# Patient Record
Sex: Male | Born: 1990 | Race: Black or African American | Hispanic: No | Marital: Single | Smoking: Never smoker
Health system: Southern US, Community
[De-identification: ages and names within clinical notes are randomized; demographics above are authoritative.]

## PROBLEM LIST (undated history)

## (undated) HISTORY — PX: INCISION AND DRAINAGE ABSCESS: SHX5864

---
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
## 2011-01-28 ENCOUNTER — Emergency Department (HOSPITAL_COMMUNITY): Payer: BC Managed Care – PPO

## 2011-01-28 ENCOUNTER — Emergency Department (HOSPITAL_COMMUNITY)
Admission: EM | Admit: 2011-01-28 | Discharge: 2011-01-28 | Disposition: A | Discharge status: Home / Self Care | Payer: BC Managed Care – PPO | Attending: Emergency Medicine | Admitting: Emergency Medicine

## 2011-01-28 DIAGNOSIS — R071 Chest pain on breathing: Secondary | ICD-10-CM | POA: Insufficient documentation

## 2013-03-31 ENCOUNTER — Emergency Department (HOSPITAL_COMMUNITY)
Admission: EM | Admit: 2013-03-31 | Discharge: 2013-03-31 | Disposition: A | Discharge status: Home / Self Care | Payer: BC Managed Care – PPO | Attending: Emergency Medicine | Admitting: Emergency Medicine

## 2013-03-31 ENCOUNTER — Encounter (HOSPITAL_COMMUNITY): Payer: Self-pay | Admitting: Emergency Medicine

## 2013-03-31 DIAGNOSIS — R369 Urethral discharge, unspecified: Secondary | ICD-10-CM | POA: Insufficient documentation

## 2013-03-31 DIAGNOSIS — Z113 Encounter for screening for infections with a predominantly sexual mode of transmission: Secondary | ICD-10-CM | POA: Insufficient documentation

## 2013-03-31 DIAGNOSIS — N342 Other urethritis: Secondary | ICD-10-CM

## 2013-03-31 DIAGNOSIS — L539 Erythematous condition, unspecified: Secondary | ICD-10-CM | POA: Insufficient documentation

## 2013-03-31 DIAGNOSIS — Z711 Person with feared health complaint in whom no diagnosis is made: Secondary | ICD-10-CM

## 2013-03-31 LAB — GC/CHLAMYDIA PROBE AMP
CT Probe RNA: NEGATIVE
GC Probe RNA: NEGATIVE

## 2013-03-31 LAB — URINALYSIS, ROUTINE W REFLEX MICROSCOPIC
Ketones, ur: NEGATIVE mg/dL
Nitrite: NEGATIVE
Protein, ur: NEGATIVE mg/dL
Urobilinogen, UA: 0.2 mg/dL (ref 0.0–1.0)

## 2013-03-31 LAB — URINE MICROSCOPIC-ADD ON

## 2013-03-31 MED ORDER — CEFTRIAXONE SODIUM 250 MG IJ SOLR
250.0000 mg | Freq: Once | INTRAMUSCULAR | Status: AC
  Administered 2013-03-31: 250 mg via INTRAMUSCULAR
  Filled 2013-03-31: qty 250

## 2013-03-31 MED ORDER — LIDOCAINE HCL 1 % IJ SOLN
INTRAMUSCULAR | Status: AC
  Administered 2013-03-31: 1.9 mL
  Filled 2013-03-31: qty 20

## 2013-03-31 MED ORDER — AZITHROMYCIN 250 MG PO TABS
1000.0000 mg | ORAL_TABLET | Freq: Once | ORAL | Status: AC
  Administered 2013-03-31: 1000 mg via ORAL
  Filled 2013-03-31: qty 4

## 2013-03-31 NOTE — ED Provider Notes (Signed)
CSN: [id number]     Arrival date & time 03/31/13  0247 History   First MD Initiated Contact with Patient 03/31/13 0257     Chief Complaint  Patient presents with  . SEXUALLY TRANSMITTED DISEASE   (Consider location/radiation/quality/duration/timing/severity/associated sxs/prior Treatment) Patient is a 22 y.o. male presenting with male genitourinary complaint.  Male GU Problem Presenting symptoms: penile discharge (moderate amount, yellow) and penile pain   Presenting symptoms: no scrotal pain   Context: spontaneously   Relieved by:  Nothing Worsened by:  Tactile pressure Ineffective treatments:  None tried Associated symptoms: penile redness   Associated symptoms: no abdominal pain, no fever, no genital lesions, no scrotal swelling and no vomiting   Risk factors: multiple sexual partners and unprotected sex     History reviewed. No pertinent past medical history. Past Surgical History  Procedure Laterality Date  . Incision and drainage abscess     No family history on file. History  Substance Use Topics  . Smoking status: Never Smoker   . Smokeless tobacco: Not on file  . Alcohol Use: No    Review of Systems  Constitutional: Negative for fever.  Gastrointestinal: Negative for vomiting and abdominal pain.  Genitourinary: Positive for discharge (moderate amount, yellow) and penile pain. Negative for scrotal swelling.  All other systems reviewed and are negative.    Allergies  Review of patient's allergies indicates no known allergies.  Home Medications  No current outpatient prescriptions on file. BP 124/71  Pulse 64  Temp(Src) 98.4 F (36.9 C)  Resp 20  SpO2 97% Physical Exam  Nursing note and vitals reviewed. Constitutional: He is oriented to person, place, and time. He appears well-developed and well-nourished. No distress.  HENT:  Head: Normocephalic and atraumatic.  Eyes: Conjunctivae are normal. No scleral icterus.  Neck: Neck supple.  Cardiovascular:  Normal rate and intact distal pulses.   Pulmonary/Chest: Effort normal. No stridor. No respiratory distress.  Abdominal: Soft. Normal appearance. He exhibits no distension. There is no tenderness. There is no rebound and no guarding.  Genitourinary: Testes normal. Right testis shows no tenderness. Left testis shows no tenderness. Uncircumcised. Penile erythema and penile tenderness present. No phimosis or paraphimosis. Discharge found.  Neurological: He is alert and oriented to person, place, and time.  Skin: Skin is warm and dry. No rash noted.  Psychiatric: He has a normal mood and affect. His behavior is normal.    ED Course  Procedures (including critical care time) Labs Review Labs Reviewed  URINALYSIS, ROUTINE W REFLEX MICROSCOPIC - Abnormal; Notable for the following:    Hgb urine dipstick SMALL (*)    Leukocytes, UA SMALL (*)    All other components within normal limits  GC/CHLAMYDIA PROBE AMP  URINE MICROSCOPIC-ADD ON   Imaging Review No results found.  EKG Interpretation   None       MDM   1. Concern about sexually transmitted disease in male without diagnosis   2. Urethritis    Penile discharge and pain.  No lesions.  Rocephin and azithro presumptively.  GC/Chl pending.        Candyce Churn, MD 03/31/13 [phone]

## 2013-03-31 NOTE — ED Notes (Signed)
Pt states may have "STI"; c/o discharge and irritation; states has had unprotected sex;

## 2013-05-16 ENCOUNTER — Encounter (HOSPITAL_COMMUNITY): Payer: Self-pay | Admitting: Emergency Medicine

## 2013-05-16 ENCOUNTER — Emergency Department (HOSPITAL_COMMUNITY)
Admission: EM | Admit: 2013-05-16 | Discharge: 2013-05-16 | Disposition: A | Discharge status: Home / Self Care | Payer: BC Managed Care – PPO | Attending: Emergency Medicine | Admitting: Emergency Medicine

## 2013-05-16 DIAGNOSIS — K5289 Other specified noninfective gastroenteritis and colitis: Secondary | ICD-10-CM | POA: Insufficient documentation

## 2013-05-16 DIAGNOSIS — K529 Noninfective gastroenteritis and colitis, unspecified: Secondary | ICD-10-CM

## 2013-05-16 NOTE — ED Notes (Signed)
Pt c/o gen abd pain x 1 day w/ diarrhea.

## 2013-05-16 NOTE — Discharge Instructions (Signed)
Return to the emergency department for severe pain, bloody stool, or no urine output in 12 hours.   Viral Gastroenteritis Viral gastroenteritis is also known as stomach flu. This condition affects the stomach and intestinal tract. It can cause sudden diarrhea and vomiting. The illness typically lasts 3 to 8 days. Most people develop an immune response that eventually gets rid of the virus. While this natural response develops, the virus can make you quite ill. CAUSES  Many different viruses can cause gastroenteritis, such as rotavirus or noroviruses. You can catch one of these viruses by consuming contaminated food or water. You may also catch a virus by sharing utensils or other personal items with an infected person or by touching a contaminated surface. SYMPTOMS  The most common symptoms are diarrhea and vomiting. These problems can cause a severe loss of body fluids (dehydration) and a body salt (electrolyte) imbalance. Other symptoms may include:  Fever.  Headache.  Fatigue.  Abdominal pain. DIAGNOSIS  Your caregiver can usually diagnose viral gastroenteritis based on your symptoms and a physical exam. A stool sample may also be taken to test for the presence of viruses or other infections. TREATMENT  This illness typically goes away on its own. Treatments are aimed at rehydration. The most serious cases of viral gastroenteritis involve vomiting so severely that you are not able to keep fluids down. In these cases, fluids must be given through an intravenous line (IV). HOME CARE INSTRUCTIONS   Drink enough fluids to keep your urine clear or pale yellow. Drink small amounts of fluids frequently and increase the amounts as tolerated.  Ask your caregiver for specific rehydration instructions.  Avoid:  Foods high in sugar.  Alcohol.  Carbonated drinks.  Tobacco.  Juice.  Caffeine drinks.  Extremely hot or cold fluids.  Fatty, greasy foods.  Too much intake of anything at  one time.  Dairy products until 24 to 48 hours after diarrhea stops.  You may consume probiotics. Probiotics are active cultures of beneficial bacteria. They may lessen the amount and number of diarrheal stools in adults. Probiotics can be found in yogurt with active cultures and in supplements.  Wash your hands well to avoid spreading the virus.  Only take over-the-counter or prescription medicines for pain, discomfort, or fever as directed by your caregiver. Do not give aspirin to children. Antidiarrheal medicines are not recommended.  Ask your caregiver if you should continue to take your regular prescribed and over-the-counter medicines.  Keep all follow-up appointments as directed by your caregiver. SEEK IMMEDIATE MEDICAL CARE IF:   You are unable to keep fluids down.  You do not urinate at least once every 6 to 8 hours.  You develop shortness of breath.  You notice blood in your stool or vomit. This may look like coffee grounds.  You have abdominal pain that increases or is concentrated in one small area (localized).  You have persistent vomiting or diarrhea.  You have a fever.  The patient is a child younger than 3 months, and he or she has a fever.  The patient is a child older than 3 months, and he or she has a fever and persistent symptoms.  The patient is a child older than 3 months, and he or she has a fever and symptoms suddenly get worse.  The patient is a baby, and he or she has no tears when crying. MAKE SURE YOU:   Understand these instructions.  Will watch your condition.  Will get help right  away if you are not doing well or get worse. Document Released: 04/22/2005 Document Revised: 07/15/2011 Document Reviewed: 02/06/2011 [facility] Patient Information 2014 [place], [place].

## 2013-05-16 NOTE — ED Provider Notes (Signed)
CSN: [id number]     Arrival date & time 05/16/13  1129 History   First MD Initiated Contact with Patient 05/16/13 1155     Chief Complaint  Patient presents with  . Abdominal Pain  . Diarrhea   (Consider location/radiation/quality/duration/timing/severity/associated sxs/prior Treatment) HPI Comments: Patient is a 23 year old male with a two-day history of abdominal cramping and loose stools. He has not had any vomiting and denies fever. His child at home was sick in a similar fashion several days ago. He just urinated prior to being placed in the exam room.  Patient is a 23 y.o. male presenting with abdominal pain and diarrhea. The history is provided by the patient.  Abdominal Pain Pain location:  Generalized Pain quality: cramping   Pain severity:  Moderate Onset quality:  Gradual Duration:  2 days Timing:  Constant Progression:  Partially resolved Chronicity:  New Relieved by:  Nothing Worsened by:  Nothing tried Associated symptoms: diarrhea   Diarrhea Associated symptoms: abdominal pain     History reviewed. No pertinent past medical history. Past Surgical History  Procedure Laterality Date  . Incision and drainage abscess     No family history on file. History  Substance Use Topics  . Smoking status: Never Smoker   . Smokeless tobacco: Not on file  . Alcohol Use: No    Review of Systems  Gastrointestinal: Positive for abdominal pain and diarrhea.  All other systems reviewed and are negative.    Allergies  Review of patient's allergies indicates no known allergies.  Home Medications   Current Outpatient Rx  Name  Route  Sig  Dispense  Refill  . ibuprofen (ADVIL,MOTRIN) 200 MG tablet   Oral   Take 400 mg by mouth every 6 (six) hours as needed.          BP 133/69  Pulse 73  Temp(Src) 98.6 F (37 C) (Oral)  Resp 14  Ht 5\' 7"  (1.702 m)  Wt 190 lb (86.183 kg)  BMI 29.75 kg/m2  SpO2 97% Physical Exam  Nursing note and vitals  reviewed. Constitutional: He is oriented to person, place, and time. He appears well-developed and well-nourished. No distress.  HENT:  Head: Normocephalic and atraumatic.  Mouth/Throat: Oropharynx is clear and moist.  Neck: Normal range of motion. Neck supple.  Cardiovascular: Normal rate, regular rhythm and normal heart sounds.   No murmur heard. Pulmonary/Chest: Effort normal and breath sounds normal. No respiratory distress. He has no wheezes.  Abdominal: Soft. Bowel sounds are normal. He exhibits no distension and no mass. There is no tenderness.  Musculoskeletal: Normal range of motion. He exhibits no edema.  Neurological: He is alert and oriented to person, place, and time.  Skin: Skin is warm and dry. He is not diaphoretic.    ED Course  Procedures (including critical care time) Labs Review Labs Reviewed - No data to display Imaging Review No results found.    MDM  No diagnosis found. Symptoms sound viral in nature. He appears well-hydrated states that his symptoms are actually improving. He is requesting a work note. Will discharge to home with instructions to return for bloody stool severe pain or other problems.    Geoffery Lyonsouglas Anush Wiedeman, MD 05/16/13 [phone]

## 2014-05-26 ENCOUNTER — Encounter (HOSPITAL_COMMUNITY): Payer: Self-pay | Admitting: *Deleted

## 2014-05-26 ENCOUNTER — Emergency Department (HOSPITAL_COMMUNITY)
Admission: EM | Admit: 2014-05-26 | Discharge: 2014-05-26 | Disposition: A | Discharge status: Home / Self Care | Payer: No Typology Code available for payment source | Attending: Emergency Medicine | Admitting: Emergency Medicine

## 2014-05-26 DIAGNOSIS — Y998 Other external cause status: Secondary | ICD-10-CM | POA: Diagnosis not present

## 2014-05-26 DIAGNOSIS — Y9389 Activity, other specified: Secondary | ICD-10-CM | POA: Insufficient documentation

## 2014-05-26 DIAGNOSIS — Y92410 Unspecified street and highway as the place of occurrence of the external cause: Secondary | ICD-10-CM | POA: Insufficient documentation

## 2014-05-26 DIAGNOSIS — S161XXA Strain of muscle, fascia and tendon at neck level, initial encounter: Secondary | ICD-10-CM | POA: Insufficient documentation

## 2014-05-26 DIAGNOSIS — S199XXA Unspecified injury of neck, initial encounter: Secondary | ICD-10-CM | POA: Diagnosis present

## 2014-05-26 MED ORDER — CYCLOBENZAPRINE HCL 10 MG PO TABS: 10.0000 mg | ORAL_TABLET | Freq: Two times a day (BID) | ORAL | Status: DC | PRN

## 2014-05-26 MED ORDER — IBUPROFEN 800 MG PO TABS: 800.0000 mg | ORAL_TABLET | Freq: Three times a day (TID) | ORAL | Status: DC
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
## 2014-05-26 NOTE — Discharge Instructions (Signed)
Cryotherapy °Cryotherapy is when you put ice on your injury. Ice helps lessen pain and puffiness (swelling) after an injury. Ice works the best when you start using it in the first 24 to 48 hours after an injury. °HOME CARE °· Put a dry or damp towel between the ice pack and your skin. °· You may press gently on the ice pack. °· Leave the ice on for no more than 10 to 20 minutes at a time. °· Check your skin after 5 minutes to make sure your skin is okay. °· Rest at least 20 minutes between ice pack uses. °· Stop using ice when your skin loses feeling (numbness). °· Do not use ice on someone who cannot tell you when it hurts. This includes small children and people with memory problems (dementia). °GET HELP RIGHT AWAY IF: °· You have white spots on your skin. °· Your skin turns blue or pale. °· Your skin feels waxy or hard. °· Your puffiness gets worse. °MAKE SURE YOU:  °· Understand these instructions. °· Will watch your condition. °· Will get help right away if you are not doing well or get worse. °Document Released: 10/09/2007 Document Revised: 07/15/2011 Document Reviewed: 12/13/2010 °Exit[employer]® Patient Information ©2015 [employer]. This information is not intended to replace advice given to you by your health care provider. Make sure you discuss any questions you have with your health care provider. ° °Cervical Sprain °A cervical sprain is an injury in the neck in which the strong, fibrous tissues (ligaments) that connect your neck bones stretch or tear. Cervical sprains can range from mild to severe. Severe cervical sprains can cause the neck vertebrae to be unstable. This can lead to damage of the spinal cord and can result in serious nervous system problems. The amount of time it takes for a cervical sprain to get better depends on the cause and extent of the injury. Most cervical sprains heal in 1 to 3 weeks. °CAUSES  °Severe cervical sprains may be caused by:  °· Contact sport injuries (such as from  football, rugby, wrestling, hockey, auto racing, gymnastics, diving, martial arts, or boxing).   °· Motor vehicle collisions.   °· Whiplash injuries. This is an injury from a sudden forward and backward whipping movement of the head and neck.  °· Falls.   °Mild cervical sprains may be caused by:  °· Being in an awkward position, such as while cradling a telephone between your ear and shoulder.   °· Sitting in a chair that does not offer proper support.   °· Working at a poorly designed computer station.   °· Looking up or down for long periods of time.   °SYMPTOMS  °· Pain, soreness, stiffness, or a burning sensation in the front, back, or sides of the neck. This discomfort may develop immediately after the injury or slowly, 24 hours or more after the injury.   °· Pain or tenderness directly in the middle of the back of the neck.   °· Shoulder or upper back pain.   °· Limited ability to move the neck.   °· Headache.   °· Dizziness.   °· Weakness, numbness, or tingling in the hands or arms.   °· Muscle spasms.   °· Difficulty swallowing or chewing.   °· Tenderness and swelling of the neck.   °DIAGNOSIS  °Most of the time your health care provider can diagnose a cervical sprain by taking your history and doing a physical exam. Your health care provider will ask about previous neck injuries and any known neck problems, such as arthritis in the neck.   X-rays may be taken to find out if there are any other problems, such as with the bones of the neck. Other tests, such as a CT scan or MRI, may also be needed.  °TREATMENT  °Treatment depends on the severity of the cervical sprain. Mild sprains can be treated with rest, keeping the neck in place (immobilization), and pain medicines. Severe cervical sprains are immediately immobilized. Further treatment is done to help with pain, muscle spasms, and other symptoms and may include: °· Medicines, such as pain relievers, numbing medicines, or muscle relaxants.   °· Physical  therapy. This may involve stretching exercises, strengthening exercises, and posture training. Exercises and improved posture can help stabilize the neck, strengthen muscles, and help stop symptoms from returning.   °HOME CARE INSTRUCTIONS  °· Put ice on the injured area.   °¨ Put ice in a plastic bag.   °¨ Place a towel between your skin and the bag.   °¨ Leave the ice on for 15-20 minutes, 3-4 times a day.   °· If your injury was severe, you may have been given a cervical collar to wear. A cervical collar is a two-piece collar designed to keep your neck from moving while it heals. °¨ Do not remove the collar unless instructed by your health care provider. °¨ If you have long hair, keep it outside of the collar. °¨ Ask your health care provider before making any adjustments to your collar. Minor adjustments may be required over time to improve comfort and reduce pressure on your chin or on the back of your head. °¨ If you are allowed to remove the collar for cleaning or bathing, follow your health care provider's instructions on how to do so safely. °¨ Keep your collar clean by wiping it with mild soap and water and drying it completely. If the collar you have been given includes removable pads, remove them every 1-2 days and hand wash them with soap and water. Allow them to air dry. They should be completely dry before you wear them in the collar. °¨ If you are allowed to remove the collar for cleaning and bathing, wash and dry the skin of your neck. Check your skin for irritation or sores. If you see any, tell your health care provider. °¨ Do not drive while wearing the collar.   °· Only take over-the-counter or prescription medicines for pain, discomfort, or fever as directed by your health care provider.   °· Keep all follow-up appointments as directed by your health care provider.   °· Keep all physical therapy appointments as directed by your health care provider.   °· Make any needed adjustments to your  workstation to promote good posture.   °· Avoid positions and activities that make your symptoms worse.   °· Warm up and stretch before being active to help prevent problems.   °SEEK MEDICAL CARE IF:  °· Your pain is not controlled with medicine.   °· You are unable to decrease your pain medicine over time as planned.   °· Your activity level is not improving as expected.   °SEEK IMMEDIATE MEDICAL CARE IF:  °· You develop any bleeding. °· You develop stomach upset. °· You have signs of an allergic reaction to your medicine.   °· Your symptoms get worse.   °· You develop new, unexplained symptoms.   °· You have numbness, tingling, weakness, or paralysis in any part of your body.   °MAKE SURE YOU:  °· Understand these instructions. °· Will watch your condition. °· Will get help right away if you are not doing well or get worse. °  Document Released: 02/17/2007 Document Revised: 04/27/2013 Document Reviewed: 10/28/2012 °ExitCare® Patient Information ©2015 [employer]. This information is not intended to replace advice given to you by your health care provider. Make sure you discuss any questions you have with your health care provider. °Motor Vehicle Collision °It is common to have multiple bruises and sore muscles after a motor vehicle collision (MVC). These tend to feel worse for the first 24 hours. You may have the most stiffness and soreness over the first several hours. You may also feel worse when you wake up the first morning after your collision. After this point, you will usually begin to improve with each day. The speed of improvement often depends on the severity of the collision, the number of injuries, and the location and nature of these injuries. °HOME CARE INSTRUCTIONS °· Put ice on the injured area. °¨ Put ice in a plastic bag. °¨ Place a towel between your skin and the bag. °¨ Leave the ice on for 15-20 minutes, 3-4 times a day, or as directed by your health care provider. °· Drink enough fluids to  keep your urine clear or pale yellow. Do not drink alcohol. °· Take a warm shower or bath once or twice a day. This will increase blood flow to sore muscles. °· You may return to activities as directed by your caregiver. Be careful when lifting, as this may aggravate neck or back pain. °· Only take over-the-counter or prescription medicines for pain, discomfort, or fever as directed by your caregiver. Do not use aspirin. This may increase bruising and bleeding. °SEEK IMMEDIATE MEDICAL CARE IF: °· You have numbness, tingling, or weakness in the arms or legs. °· You develop severe headaches not relieved with medicine. °· You have severe neck pain, especially tenderness in the middle of the back of your neck. °· You have changes in bowel or bladder control. °· There is increasing pain in any area of the body. °· You have shortness of breath, light-headedness, dizziness, or fainting. °· You have chest pain. °· You feel sick to your stomach (nauseous), throw up (vomit), or sweat. °· You have increasing abdominal discomfort. °· There is blood in your urine, stool, or vomit. °· You have pain in your shoulder (shoulder strap areas). °· You feel your symptoms are getting worse. °MAKE SURE YOU: °· Understand these instructions. °· Will watch your condition. °· Will get help right away if you are not doing well or get worse. °Document Released: 04/22/2005 Document Revised: 09/06/2013 Document Reviewed: 09/19/2010 °ExitCare® Patient Information ©2015 [employer]. This information is not intended to replace advice given to you by your health care provider. Make sure you discuss any questions you have with your health care provider. ° °

## 2017-02-20 ENCOUNTER — Encounter (HOSPITAL_BASED_OUTPATIENT_CLINIC_OR_DEPARTMENT_OTHER): Payer: Self-pay | Admitting: Emergency Medicine

## 2017-02-20 ENCOUNTER — Emergency Department (HOSPITAL_BASED_OUTPATIENT_CLINIC_OR_DEPARTMENT_OTHER)
Admission: EM | Admit: 2017-02-20 | Discharge: 2017-02-20 | Disposition: A | Discharge status: Home / Self Care | Payer: Self-pay | Attending: Emergency Medicine | Admitting: Emergency Medicine

## 2017-02-20 DIAGNOSIS — K029 Dental caries, unspecified: Secondary | ICD-10-CM | POA: Insufficient documentation

## 2017-02-20 MED ORDER — NAPROXEN 250 MG PO TABS
500.0000 mg | ORAL_TABLET | Freq: Once | ORAL | Status: AC
  Administered 2017-02-20: 500 mg via ORAL
  Filled 2017-02-20: qty 2

## 2017-02-20 MED ORDER — PENICILLIN V POTASSIUM 500 MG PO TABS: 500.0000 mg | ORAL_TABLET | Freq: Four times a day (QID) | ORAL | 0 refills | Status: AC

## 2017-02-20 MED ORDER — NAPROXEN 375 MG PO TABS: 375.0000 mg | ORAL_TABLET | Freq: Two times a day (BID) | ORAL | 0 refills | Status: DC

## 2017-02-20 MED ORDER — PENICILLIN V POTASSIUM 250 MG PO TABS
500.0000 mg | ORAL_TABLET | Freq: Once | ORAL | Status: AC
  Administered 2017-02-20: 500 mg via ORAL
  Filled 2017-02-20: qty 2

## 2017-02-20 NOTE — ED Triage Notes (Signed)
Pt c/o tooth pain since yesterday. Pt has not taken any medication OTC for pain.

## 2017-02-20 NOTE — ED Provider Notes (Signed)
[facility] EMERGENCY DEPARTMENT Provider Note   CSN: [id number] Arrival date & time: 02/20/17  0310     History   Chief Complaint Chief Complaint  Patient presents with  . Dental Pain    HPI Raymond Lang is a 26 y.o. male.  The history is provided by the patient.  Dental Pain   This is a new problem. The current episode started more than 2 days ago. The problem occurs constantly. The problem has not changed since onset.The pain is severe. Treatments tried: a dose of ibuprofen. The treatment provided no relief.    History reviewed. No pertinent past medical history.  There are no active problems to display for this patient.   Past Surgical History:  Procedure Laterality Date  . INCISION AND DRAINAGE ABSCESS         Home Medications    Prior to Admission medications   Medication Sig Start Date End Date Taking? Authorizing Provider  naproxen (NAPROSYN) 375 MG tablet Take 1 tablet (375 mg total) by mouth 2 (two) times daily. 02/20/17   Halea Lieb, MD  penicillin v potassium (VEETID) 500 MG tablet Take 1 tablet (500 mg total) by mouth 4 (four) times daily. 02/20/17 02/27/17  Alphus Zeck, MD    Family History No family history on file.  Social History Social History  Substance Use Topics  . Smoking status: Never Smoker  . Smokeless tobacco: Never Used  . Alcohol use No     Allergies   Patient has no known allergies.   Review of Systems Review of Systems  Constitutional: Negative for fever.  HENT: Positive for dental problem. Negative for congestion, drooling, sore throat, trouble swallowing and voice change.   Hematological: Negative for adenopathy.  All other systems reviewed and are negative.    Physical Exam Updated Vital Signs BP 131/71 (BP Location: Left Arm)   Pulse 75   Temp 98 F (36.7 C) (Oral)   Resp 18   Ht 5\' 8"  (1.727 m)   Wt 88.9 kg (196 lb)   SpO2 97%   BMI 29.80 kg/m   Physical Exam  Constitutional: He is  oriented to person, place, and time. He appears well-developed and well-nourished. No distress.  HENT:  Head: Normocephalic and atraumatic.  Mouth/Throat: No trismus in the jaw. Dental caries present. No dental abscesses. No oropharyngeal exudate.  Eyes: Pupils are equal, round, and reactive to light. Conjunctivae are normal.  Neck: Normal range of motion. Neck supple. No tracheal deviation present.  Cardiovascular: Normal rate, regular rhythm, normal heart sounds and intact distal pulses.   Pulmonary/Chest: Effort normal and breath sounds normal. He has no wheezes. He has no rales.  Abdominal: Soft. Bowel sounds are normal. He exhibits no mass. There is no tenderness. There is no rebound and no guarding.  Musculoskeletal: Normal range of motion.  Lymphadenopathy:    He has no cervical adenopathy.  Neurological: He is alert and oriented to person, place, and time.  Skin: Skin is warm. Capillary refill takes less than 2 seconds.  Psychiatric: He has a normal mood and affect.     ED Treatments / Results   Vitals:   02/20/17 0316  BP: 131/71  Pulse: 75  Resp: 18  Temp: 98 F (36.7 C)  SpO2: 97%    Radiology No results found.  Procedures Procedures (including critical care time)  Medications Ordered in ED Medications  penicillin v potassium (VEETID) tablet 500 mg (500 mg Oral Given 02/20/17 0328)  naproxen (NAPROSYN)  tablet 500 mg (500 mg Oral Given 02/20/17 0328)      Final Clinical Impressions(s) / ED Diagnoses   Final diagnoses:  Dental caries   Strict return precautions given for  Shortness of breath, swelling or the lips or tongue, chest pain, dyspnea on exertion, new weakness or numbness changes in vision or speech,  Inability to tolerate liquids or food, changes in voice cough, altered mental status or any concerns. No signs of systemic illness or infection. The patient is nontoxic-appearing on exam and vital signs are within normal limits.    I have reviewed  the triage vital signs and the nursing notes. Pertinent labs &imaging results that were available during my care of the patient were reviewed by me and considered in my medical decision making (see chart for details).  After history, exam, and medical workup I feel the patient has been appropriately medically screened and is safe for discharge home. Pertinent diagnoses were discussed with the patient. Patient was given return precautions.  New Prescriptions New Prescriptions   NAPROXEN (NAPROSYN) 375 MG TABLET    Take 1 tablet (375 mg total) by mouth 2 (two) times daily.   PENICILLIN V POTASSIUM (VEETID) 500 MG TABLET    Take 1 tablet (500 mg total) by mouth 4 (four) times daily.     Makeila Yamaguchi, MD 02/20/17 [phone]

## 2020-09-29 ENCOUNTER — Emergency Department (HOSPITAL_COMMUNITY): Payer: 59

## 2020-09-29 ENCOUNTER — Observation Stay (HOSPITAL_COMMUNITY)
Admission: EM | Admit: 2020-09-29 | Discharge: 2020-09-30 | Disposition: A | Discharge status: Home / Self Care | Payer: 59 | Attending: Internal Medicine | Admitting: Internal Medicine

## 2020-09-29 ENCOUNTER — Other Ambulatory Visit: Payer: Self-pay

## 2020-09-29 ENCOUNTER — Encounter (HOSPITAL_COMMUNITY): Payer: Self-pay

## 2020-09-29 DIAGNOSIS — Z20822 Contact with and (suspected) exposure to covid-19: Secondary | ICD-10-CM | POA: Diagnosis not present

## 2020-09-29 DIAGNOSIS — N179 Acute kidney failure, unspecified: Secondary | ICD-10-CM | POA: Insufficient documentation

## 2020-09-29 DIAGNOSIS — J810 Acute pulmonary edema: Secondary | ICD-10-CM

## 2020-09-29 DIAGNOSIS — T402X1A Poisoning by other opioids, accidental (unintentional), initial encounter: Secondary | ICD-10-CM | POA: Diagnosis present

## 2020-09-29 DIAGNOSIS — J9601 Acute respiratory failure with hypoxia: Secondary | ICD-10-CM | POA: Insufficient documentation

## 2020-09-29 DIAGNOSIS — T40601A Poisoning by unspecified narcotics, accidental (unintentional), initial encounter: Secondary | ICD-10-CM

## 2020-09-29 DIAGNOSIS — R0902 Hypoxemia: Secondary | ICD-10-CM

## 2020-09-29 DIAGNOSIS — I21A1 Myocardial infarction type 2: Secondary | ICD-10-CM | POA: Insufficient documentation

## 2020-09-29 LAB — CBC WITH DIFFERENTIAL/PLATELET
Abs Immature Granulocytes: 0.09 10*3/uL — ABNORMAL HIGH (ref 0.00–0.07)
Basophils Absolute: 0 10*3/uL (ref 0.0–0.1)
Basophils Relative: 0 %
Eosinophils Absolute: 0 10*3/uL (ref 0.0–0.5)
Eosinophils Relative: 0 %
HCT: 45.8 % (ref 39.0–52.0)
Hemoglobin: 15.3 g/dL (ref 13.0–17.0)
Immature Granulocytes: 1 %
Lymphocytes Relative: 6 %
Lymphs Abs: 0.8 10*3/uL (ref 0.7–4.0)
MCH: 29.9 pg (ref 26.0–34.0)
MCHC: 33.4 g/dL (ref 30.0–36.0)
MCV: 89.5 fL (ref 80.0–100.0)
Monocytes Absolute: 0.7 10*3/uL (ref 0.1–1.0)
Monocytes Relative: 5 %
Neutro Abs: 12.5 10*3/uL — ABNORMAL HIGH (ref 1.7–7.7)
Neutrophils Relative %: 88 %
Platelets: 223 10*3/uL (ref 150–400)
RBC: 5.12 MIL/uL (ref 4.22–5.81)
RDW: 11.7 % (ref 11.5–15.5)
WBC: 14.2 10*3/uL — ABNORMAL HIGH (ref 4.0–10.5)
nRBC: 0 % (ref 0.0–0.2)

## 2020-09-29 LAB — RAPID URINE DRUG SCREEN, HOSP PERFORMED
Amphetamines: NOT DETECTED
Barbiturates: NOT DETECTED
Benzodiazepines: NOT DETECTED
Cocaine: NOT DETECTED
Opiates: NOT DETECTED
Tetrahydrocannabinol: POSITIVE — AB

## 2020-09-29 LAB — COMPREHENSIVE METABOLIC PANEL
ALT: 18 U/L (ref 0–44)
AST: 22 U/L (ref 15–41)
Albumin: 3.9 g/dL (ref 3.5–5.0)
Alkaline Phosphatase: 65 U/L (ref 38–126)
Anion gap: 7 (ref 5–15)
BUN: 13 mg/dL (ref 6–20)
CO2: 29 mmol/L (ref 22–32)
Calcium: 9.3 mg/dL (ref 8.9–10.3)
Chloride: 104 mmol/L (ref 98–111)
Creatinine, Ser: 1.34 mg/dL — ABNORMAL HIGH (ref 0.61–1.24)
GFR, Estimated: >60 mL/min (ref >60)
Glucose, Bld: 107 mg/dL — ABNORMAL HIGH (ref 70–99)
Potassium: 3.5 mmol/L (ref 3.5–5.1)
Sodium: 140 mmol/L (ref 135–145)
Total Bilirubin: 1.1 mg/dL (ref 0.3–1.2)
Total Protein: 6.6 g/dL (ref 6.5–8.1)

## 2020-09-29 LAB — TROPONIN I (HIGH SENSITIVITY)
Troponin I (High Sensitivity): 34 ng/L — ABNORMAL HIGH (ref <18)
Troponin I (High Sensitivity): 92 ng/L — ABNORMAL HIGH (ref <18)

## 2020-09-29 LAB — RESP PANEL BY RT-PCR (FLU A&B, COVID) ARPGX2
Influenza A by PCR: NEGATIVE
Influenza B by PCR: NEGATIVE
SARS Coronavirus 2 by RT PCR: NEGATIVE

## 2020-09-29 LAB — BRAIN NATRIURETIC PEPTIDE: B Natriuretic Peptide: 17.7 pg/mL (ref 0.0–100.0)

## 2020-09-29 LAB — CK: Total CK: 198 U/L (ref 49–397)

## 2020-09-29 MED ORDER — LACTATED RINGERS IV BOLUS
1000.0000 mL | Freq: Once | INTRAVENOUS | Status: AC
  Administered 2020-09-29: 1000 mL via INTRAVENOUS

## 2020-09-29 MED ORDER — ACETAMINOPHEN 325 MG PO TABS
650.0000 mg | ORAL_TABLET | Freq: Four times a day (QID) | ORAL | Status: DC | PRN
  Administered 2020-09-30: 650 mg via ORAL
  Filled 2020-09-29: qty 2

## 2020-09-29 MED ORDER — ACETAMINOPHEN 650 MG RE SUPP: 650.0000 mg | Freq: Four times a day (QID) | RECTAL | Status: DC | PRN

## 2020-09-29 MED ORDER — ENOXAPARIN SODIUM 40 MG/0.4ML IJ SOSY
40.0000 mg | PREFILLED_SYRINGE | INTRAMUSCULAR | Status: DC
  Administered 2020-09-29: 40 mg via SUBCUTANEOUS
  Filled 2020-09-29: qty 0.4

## 2020-09-29 MED ORDER — ONDANSETRON HCL 4 MG/2ML IJ SOLN: 4.0000 mg | Freq: Once | INTRAMUSCULAR | Status: DC

## 2020-09-29 NOTE — ED Notes (Signed)
Pt O2 noted to be 74% RA. Notified provider

## 2020-09-29 NOTE — ED Provider Notes (Signed)
[facility] EMERGENCY DEPARTMENT Provider Note   CSN: [id number] Arrival date & time: 09/29/20  1538     History Chief Complaint  Patient presents with  . Drug Overdose    Raymond Lang is a 30 y.o. male presents to the Emergency Department complaining of acute, improved overdose.  Per EMS, patient found unresponsive in his vehicle.  First arriving responders had to break the window to access patient.  Narcan was given.  Patient reports he remembers helping his sister move earlier today.  He then snorted 1 tablet of what he presumed was Percocet.  He reports that he occasionally snorts it and sometimes takes them.  Denies alcohol usage.  Denies IV drug usage.  Denies snorting or other utilization of cocaine, heroin or other drugs.  He does report that he smokes marijuana intermittently.  Drinks socially.  Patient denies a history of drug overdose.  Reports that he is anxious at this time and tired but denies pain.  He does not know if he vomited.  No additional aggravating or alleviating factors.  Patient denies suicidal ideation, homicidal ideation, auditory or visual hallucinations.  He is adamant that today's overdose was accidental.  Mother at bedside assists with history.  She reports that to her knowledge this has never happened before. The history is provided by the patient and medical records. No language interpreter was used.       History reviewed. No pertinent past medical history.  There are no problems to display for this patient.   Past Surgical History:  Procedure Laterality Date  . INCISION AND DRAINAGE ABSCESS         No family history on file.  Social History   Tobacco Use  . Smoking status: Never Smoker  . Smokeless tobacco: Never Used  Vaping Use  . Vaping Use: Every day  Substance Use Topics  . Alcohol use: No  . Drug use: Never    Home Medications Prior to Admission medications   Medication Sig Start Date End Date Taking?  Authorizing Provider  naproxen (NAPROSYN) 375 MG tablet Take 1 tablet (375 mg total) by mouth 2 (two) times daily. 02/20/17   Palumbo, April, MD    Allergies    Patient has no known allergies.  Review of Systems   Review of Systems  Constitutional: Negative for appetite change, diaphoresis, fatigue, fever and unexpected weight change.  HENT: Negative for mouth sores.   Eyes: Negative for visual disturbance.  Respiratory: Negative for cough, chest tightness, shortness of breath and wheezing.   Cardiovascular: Negative for chest pain.  Gastrointestinal: Negative for abdominal pain, constipation, diarrhea, nausea and vomiting.  Endocrine: Negative for polydipsia, polyphagia and polyuria.  Genitourinary: Negative for dysuria, frequency, hematuria and urgency.  Musculoskeletal: Negative for back pain and neck stiffness.  Skin: Negative for rash.  Allergic/Immunologic: Negative for immunocompromised state.  Neurological: Negative for syncope, light-headedness and headaches.  Hematological: Does not bruise/bleed easily.  Psychiatric/Behavioral: Negative for sleep disturbance. The patient is nervous/anxious.     Physical Exam Updated Vital Signs BP 122/78   Pulse (!) 115   Temp 98.2 F (36.8 C) (Oral)   Resp 16   Ht 5\' 8"  (1.727 m)   Wt 88.9 kg   SpO2 93%   BMI 29.80 kg/m   Physical Exam Vitals and nursing note reviewed.  Constitutional:      General: He is not in acute distress.    Appearance: He is not diaphoretic.  HENT:  Head: Normocephalic.  Eyes:     General: No scleral icterus.    Conjunctiva/sclera: Conjunctivae normal.  Cardiovascular:     Rate and Rhythm: Regular rhythm. Tachycardia present.     Pulses: Normal pulses.          Radial pulses are 2+ on the right side and 2+ on the left side.  Pulmonary:     Effort: No tachypnea, accessory muscle usage, prolonged expiration, respiratory distress or retractions.     Breath sounds: Normal breath sounds. No  stridor.     Comments: Equal chest rise. No increased work of breathing. Abdominal:     General: There is no distension.     Palpations: Abdomen is soft.     Tenderness: There is no abdominal tenderness. There is no guarding or rebound.  Musculoskeletal:     Cervical back: Normal range of motion.     Comments: Moves all extremities equally and without difficulty.  Skin:    General: Skin is warm and dry.     Capillary Refill: Capillary refill takes less than 2 seconds.  Neurological:     Mental Status: He is alert.     GCS: GCS eye subscore is 4. GCS verbal subscore is 5. GCS motor subscore is 6.     Comments: Speech is clear and goal oriented.  Psychiatric:        Mood and Affect: Mood is anxious. Affect is tearful.     Comments: Anxious and tearful     ED Results / Procedures / Treatments   Labs (all labs ordered are listed, but only abnormal results are displayed) Labs Reviewed  CBC WITH DIFFERENTIAL/PLATELET - Abnormal; Notable for the following components:      Result Value   WBC 14.2 (*)    Neutro Abs 12.5 (*)    Abs Immature Granulocytes 0.09 (*)    All other components within normal limits  COMPREHENSIVE METABOLIC PANEL - Abnormal; Notable for the following components:   Glucose, Bld 107 (*)    Creatinine, Ser 1.34 (*)    All other components within normal limits  RAPID URINE DRUG SCREEN, HOSP PERFORMED - Abnormal; Notable for the following components:   Tetrahydrocannabinol POSITIVE (*)    All other components within normal limits  TROPONIN I (HIGH SENSITIVITY) - Abnormal; Notable for the following components:   Troponin I (High Sensitivity) 34 (*)    All other components within normal limits  RESP PANEL BY RT-PCR (FLU A&B, COVID) ARPGX2  BRAIN NATRIURETIC PEPTIDE  TROPONIN I (HIGH SENSITIVITY)    EKG     Radiology DG Chest 2 View  Result Date: 09/29/2020 CLINICAL DATA:  Possible pulmonary edema EXAM: CHEST - 2 VIEW COMPARISON:  Radiograph 09/29/2020  FINDINGS: No focal consolidation. No convincing features of developing pulmonary edema with normal distribution of the pulmonary vascularity. No pneumothorax or visible effusion. The cardiomediastinal contours are unremarkable. Telemetry leads overlie the chest. No acute osseous or soft tissue abnormality. IMPRESSION: No convincing evidence of developing pulmonary edema. Lungs are clear. Electronically Signed   By: Kreg Shropshire M.D.   On: 09/29/2020 17:19   DG Chest 2 View  Result Date: 09/29/2020 CLINICAL DATA:  Recent overdose EXAM: CHEST - 2 VIEW COMPARISON:  01/28/2011 FINDINGS: The heart size and mediastinal contours are within normal limits. Both lungs are clear. The visualized skeletal structures are unremarkable. IMPRESSION: No active cardiopulmonary disease. Electronically Signed   By: Alcide Clever M.D.   On: 09/29/2020 16:33    Procedures  Procedures   Medications Ordered in ED Medications  ondansetron (ZOFRAN) injection 4 mg (0 mg Intravenous Hold 09/29/20 1754)    ED Course  I have reviewed the triage vital signs and the nursing notes.  Pertinent labs & imaging results that were available during my care of the patient were reviewed by me and considered in my medical decision making (see chart for details).    MDM Rules/Calculators/A&P                           Presents after accidental overdose.  Discussed with patient and family.  We will check basic blood work, chest x-ray and monitor.  4:48 PM Patient now with oxygen saturations in the mid 70s.  He is completely alert, mentating and taking full deep breaths.  Reevaluation of lung exam finds patient with audible rails.  Concern for flash pulmonary edema secondary to Narcan administration.  Patient placed on oxygen.  Will test for COVID. Pt will need admission.   6:29 PM Labs with mild leukocytosis, elevated serum creatinine and elevated troponin at 34.  Patient denies chest pain.  Continues to have hypoxia and rales on  arrival.  Tachycardic on arrival however tachycardia has improved.  Patient remains tachypneic without respiratory distress.  Remains on 3 L via nasal cannula.  6:40 PM Discussed with Internal Medicine teaching service who will admit.  Fianc at bedside requests update as needed for changes in patient's status:  Fabienne Bruns [phone]    Final Clinical Impression(s) / ED Diagnoses Final diagnoses:  Opiate overdose, accidental or unintentional, initial encounter [facility])  Hypoxia  Acute pulmonary edema [facility])    Rx / DC Orders ED Discharge Orders    None       Mardene Sayer Boyd Kerbs 09/29/20 1905    Milagros Loll, MD 09/29/20 [phone]

## 2020-09-29 NOTE — H&P (Signed)
Date: 09/29/2020               Patient Name:  Raymond Lang MRN: [id number]  DOB: Apr 24, 1991 Age / Sex: 30 y.o., male   PCP: Medicine, [facility] (Inactive)         Medical Service: Internal Medicine Teaching Service         Attending Physician: Dr. Reymundo Poll    First Contact: Dr. Evlyn Kanner Pager: [phone]  Second Contact: Dr. Eliezer Bottom Pager: [phone]       After Hours (After 5p/  First Contact Pager: [phone]  weekends / holidays): Second Contact Pager: [phone]   Chief Complaint: unintentional drug overdose, hypoxia  History of Present Illness:   [name] is a 30 year old man with history of recreational opioid use, vaping, obesity, and seasonal allergies presenting to the [facility] ED after accidental drug overdose.  Patient reports he was in his usual state of health this morning and helped his sister move. He then left his sister's house and got in his car where he snorted a crushed tablet of what he thought was Percocet. The next thing he remembers is seeing EMS who told him they gave him Narcan. States he felt tired, mildly nauseated, and "a little out of it" but otherwise felt fine.  States he has not had any shortness of breath since being in the ED and feels no different since being put on oxygen for hypoxia. Denies recent fevers or chills, headache, dizziness, lightheadedness, chest pain, cough, shortness of breath, abdominal pain, dysuria, diarrhea, weakness, SI. States he felt anxious in the ED initially in part because he was overwhelmed from the events of the day and has been worried about his job finding out.  Reports he sees his PCP about yearly for check-ups and takes no medications. On review of Care Everywhere, his last PCP appointment was 02/2019 for an annual physical, and he has previously been prescribed medications for seasonal allergies. Regarding his drug use, states he uses opioids "periodically," a few times monthly.  He denies history of prior drug overdose or withdrawal. Also smokes marijuana a few times a month. Quit smoking cigarettes about 9 months ago and transitioned to vaping daily. Drinks alcohol socially, 2-3 drinks on weekends.  EMS/ED course: Per chart review, patient found unresponsive by family member who called 911. Fire department initially responded and found the patient breathing ~4-5 bpm and started rescue breaths. Upon arrival to the scene, EMS administered 1 mg Narcan intranasally and patient regained consciousness. He was tachycardic but otherwise HDS. In the ED, afebrile, tachypneic with RR 21-27, P 70s-90s, BP 94-110s/70s. Placed on 2 L supplemental oxygen via nasal cannula for oxygen desaturation to the mid 70s. CXR x 2 obtained due EDP auscultation of rales was unrevealing. Lab work notable for leukocytosis (14.2) and BUN/Cr 13/1.34 (baseline ~1). BNP 17.7. Trop 34>92. UDS positive for THC only. COVID/Flu negative.   Meds:  No current facility-administered medications on file prior to encounter.   Current Outpatient Medications on File Prior to Encounter  Medication Sig Dispense Refill  . naproxen (NAPROSYN) 375 MG tablet Take 1 tablet (375 mg total) by mouth 2 (two) times daily. 20 tablet 0   Allergies: Allergies as of 09/29/2020  . (No Known Allergies)   History reviewed. No pertinent past medical history.  Family History:  Father: Died at 68 from esophageal cancer Mother: Healthy Sister: T2DM Maternal grandmother: COPD  Social History: Lives with his fiance and  52 year old son in [place]. Works as a Banker. Remainder of contributory social history as per HPI.  Review of Systems: A complete ROS was negative except as per HPI.   Physical Exam: Blood pressure 109/72, pulse 87, temperature 98.2 F (36.8 C), temperature source Oral, resp. rate (!) 26, height 5\' 8"  (1.727 m), weight 88.9 kg, SpO2 (!) 88 %.  SpO2 during exam 88-97% though pleth  inconsistent Constitutional: well-appearing man lying in bed, in no acute distress HENT: normocephalic atraumatic, mucous membranes moist Eyes: conjunctiva non-erythematous, pupils round, 2 mm bilaterally, and equally reactive to light Neck: supple, no lymphadenopathy Cardiovascular: regular rate and rhythm, no m/r/g, no lower extremity edema Pulmonary/Chest: normal work of breathing on 2L [place], speaking in full sentences without dyspnea, bibasilar and R-sided rales to the mid lung field, no wheezes or rhonchi Abdominal: soft, non-tender, non-distended, no CVA tenderness MSK: normal bulk and tone Neurological: alert & oriented x 3 Skin: warm and dry Psych: normal mood and affect  EKG: regular rate and rhythm. t wave flattening in aVL.   CXR: hyperinflated, no focal infiltrate  Assessment & Plan by Problem: Active Problems:   Acute respiratory failure with hypoxia [facility])  Raymond Lang is a 30 year old man with history of recreational opioid use, vaping, obesity, and seasonal allergies presenting to the [facility] ED after accidental drug overdose and admitted for acute hypoxic respiratory failure.  Acute hypoxic respiratory failure secondary to opioid overdose Presenting to the ED within an hour after being found unresponsive in his car following intranasal use of what the patient presumed to be Percocet. Found to be hypoxic to the mid-70s in the ED and placed on 2 L supplemental oxygen. Initial CXR unremarkable, repeated by EDP after auscultation of rales on their exam, repeat unchanged. On my exam, patient is awake, alert, breathing comfortably on 2 L [place]. L-sided rales to the mid lung field. Of note, pulse oximeter pleth inconsistent. No significant findings on CXR to suggest pneumonitis however very possible in the setting of overdose. However, antibiotics are not indicated at this time. Leukocytosis (14.2) likely also reactive. He has reported snorting a percocet yesterday, however UDS is  negative for opioids so I suspect that this may have been a synthetic opioid that is not detected on UDS. Plan - wean oxygen to maintain O2 saturation >94% - TOC consult for discussion on substance use resources  Acute kidney injury BUN/Cr 13/1.34. Cr 1.17 at last check in 02/2019 per Care Everywhere. Given the unclear down time, checked a CK to r/o rhabdomyolysis. CK 198. Suspect dehydration, although BUN:Cr is less suggestive of this. Plan - 1L LR, encourage PO intake - check BMP in AM  Tropinemia Trop 34>92. Suspect secondary to demand in setting of respiratory depression. EKG without changes concerning for ACS. Patient denies chest pain. Plan - trend troponins - tele monitoring  Nicotine dependence Patient reports he stopped smoking cigarettes about 9 months ago and is now vaping. - encourage cessation - nicotine patch if needed  Dispo: Admit patient to Observation with expected length of stay less than 2 midnights.  Signed: 03/2019, MD Internal Medicine Resident, PGY-1 [facility] Pager: [phone] 9:55 PM, 09/29/2020

## 2020-09-29 NOTE — ED Notes (Signed)
Pt endorses taking 1 percocet today. He remembers walking to his car and then waking up in the ambulance.

## 2020-09-29 NOTE — ED Notes (Signed)
Admitting doctor at  The bedside 

## 2020-09-29 NOTE — ED Triage Notes (Signed)
Pt arrived via [facility], Per EMS, bystanders found pt unresponsive in vehicle so they broke his window and pulled him out. Fire Dept gave rescue breaths and Narcan 1 Mg IN. Pt started to breathe 4-6 breaths per min. In route pt became more responsive. Pt is A&Ox3 to person, place and time.

## 2020-09-30 ENCOUNTER — Observation Stay (HOSPITAL_BASED_OUTPATIENT_CLINIC_OR_DEPARTMENT_OTHER): Payer: 59

## 2020-09-30 ENCOUNTER — Observation Stay (HOSPITAL_COMMUNITY): Payer: 59

## 2020-09-30 DIAGNOSIS — I361 Nonrheumatic tricuspid (valve) insufficiency: Secondary | ICD-10-CM | POA: Diagnosis not present

## 2020-09-30 DIAGNOSIS — R0902 Hypoxemia: Secondary | ICD-10-CM

## 2020-09-30 DIAGNOSIS — R0602 Shortness of breath: Secondary | ICD-10-CM

## 2020-09-30 LAB — BASIC METABOLIC PANEL
Anion gap: 10 (ref 5–15)
BUN: 11 mg/dL (ref 6–20)
CO2: 27 mmol/L (ref 22–32)
Calcium: 8.6 mg/dL — ABNORMAL LOW (ref 8.9–10.3)
Chloride: 101 mmol/L (ref 98–111)
Creatinine, Ser: 1.08 mg/dL (ref 0.61–1.24)
GFR, Estimated: >60 mL/min (ref >60)
Glucose, Bld: 100 mg/dL — ABNORMAL HIGH (ref 70–99)
Potassium: 3.8 mmol/L (ref 3.5–5.1)
Sodium: 138 mmol/L (ref 135–145)

## 2020-09-30 LAB — ECHOCARDIOGRAM COMPLETE
AR max vel: 3.06 cm2
AV Area VTI: 2.87 cm2
AV Area mean vel: 2.96 cm2
AV Mean grad: 3 mmHg
AV Peak grad: 5.4 mmHg
Ao pk vel: 1.16 m/s
Area-P 1/2: 4.77 cm2
Height: 67 in
S' Lateral: 3.6 cm
Weight: 3387.2 oz

## 2020-09-30 LAB — TROPONIN I (HIGH SENSITIVITY): Troponin I (High Sensitivity): 71 ng/L — ABNORMAL HIGH

## 2020-09-30 LAB — D-DIMER, QUANTITATIVE: D-Dimer, Quant: 0.94 ug{FEU}/mL — ABNORMAL HIGH (ref 0.00–0.50)

## 2020-09-30 LAB — CBC
HCT: 39.8 % (ref 39.0–52.0)
Hemoglobin: 13.4 g/dL (ref 13.0–17.0)
MCH: 29.9 pg (ref 26.0–34.0)
MCHC: 33.7 g/dL (ref 30.0–36.0)
MCV: 88.8 fL (ref 80.0–100.0)
Platelets: 279 K/uL (ref 150–400)
RBC: 4.48 MIL/uL (ref 4.22–5.81)
RDW: 11.7 % (ref 11.5–15.5)
WBC: 9.2 K/uL (ref 4.0–10.5)
nRBC: 0 % (ref 0.0–0.2)

## 2020-09-30 LAB — HIV ANTIBODY (ROUTINE TESTING W REFLEX): HIV Screen 4th Generation wRfx: NONREACTIVE

## 2020-09-30 MED ORDER — IOHEXOL 350 MG/ML SOLN
75.0000 mL | Freq: Once | INTRAVENOUS | Status: AC | PRN
  Administered 2020-09-30: 75 mL via INTRAVENOUS

## 2020-09-30 NOTE — Hospital Course (Addendum)
5/28: Somnolent but comfortable. Uses percocet once weekly. Breathing is improved, no further dyspnea. No chest pain or palpitations.  No history of lung disease  Has been using opiates for 1-2 years. Does not interfere with work or social situations. No prior hospitalizations for this.  He does want to quit,.  Reports this is recreational and that it does not interfere with his daily activities.   Vapes daily; prior history of smoking cigarettes for 3 years, quit 9 mo-1 yr ago Works as Journalist, newspaper

## 2020-09-30 NOTE — Progress Notes (Signed)
  Echocardiogram 2D Echocardiogram has been performed.  Raymond Lang 09/30/2020, 12:08 PM

## 2020-09-30 NOTE — Discharge Summary (Addendum)
Name: Raymond Lang MRN: [id number] DOB: [birth year] 30 y.o. PCP: Medicine, [facility] (Inactive)  Date of Admission: 09/29/2020  3:38 PM Date of Discharge: 09/30/20 Attending Physician: Dr. Reymundo Poll   Discharge Diagnosis: 1. Inhalation Lung Injury  2. Acute Hypoxic Respiratory Failure (Resolved)  3. NSTEMI Type II  4. AKI (Resolved)  5. Hypocalcemia, Mild  6. Polysubstance Use Disorder   Discharge Subjective:  On day of discharge, Raymond Lang continued to deny any SOB, cough, fevers, chills, CP, palpitations, LE swelling, light-headedness, dizziness, or any other symptoms. He again stated that he uses Percocet intranasally ~1x/week and vapes daily. He denied any history of IVDU or any other illicit or non-prescribed drugs. He does not feel that he currently is addicted to the substances he uses and does not feel their use impacts his day to day functioning and living. He does mention he is interested in quitting Percocet, specifically. He was eager and comfortable with discharge today.   Discharge Medications: Allergies as of 09/30/2020   No Known Allergies      Medication List     STOP taking these medications    naproxen 375 MG tablet Commonly known as: NAPROSYN       TAKE these medications    DRY EYES OP Place 1 Drop/kg into both eyes daily as needed (dry eyes).   loratadine 10 MG tablet Commonly known as: CLARITIN Take 10 mg by mouth at bedtime.       Disposition and follow-up:   Raymond Lang was discharged from [facility] in Stable condition.  At the hospital follow up visit please address:  Please Follow Up:   Acute Hypoxic Respiratory Failure / Inhalation Lung Injury LOC / Unresponsive episode  Presented after bystanders found him unresponsive in his car. EMS called, respiratory rate of 5 on arrival but hemodynamically stable with pulse and PO2 of 90%. Improved after intranasal narcan. Persistently hypoxic on  arrival to the ED. CTA done in the setting of elevated D. CTA chest ruled out PE but did show findings consistent with inhalation lung injury. Initial hypoxia was more likely due to underlying lung injury from hx frequent vaping, although percocet inhalation PTA may have contributed. Please assess patient's oxygenation and consider PFT testing.   Polysubstance Use Disorder Possible Overdose Patient snorts Percocet ~once weekly, uses Marijuana, previously smoked cigarettes for 3 years (quit 9 mos ago) and vapes daily. He does not feel these are interfering with his life or causing him distress, although he is interested in quitting (outpatient). We discussed possible treatment options including suboxone. Does not think he needs treatment for this. Says he only uses "recreationally" and feels he can quit on his own. He was provided with information for [facility] opioid use disorder clinic [phone] and informed to call if he feels he needs treatment in the future.   NSTEMI Type II - Likely due to demand ischemia. Monitor for symptoms.   Hypocalcemia, Mild - Calcium on admission was normal although on discharge was slightly low at 8.6. No symptoms. Please follow up lab.   2.  Labs / imaging needed at time of follow-up: BMP, Calcium, Magnesium, CBC w/ Diff, consider PFT's    3.  Pending labs/ test needing follow-up: None   Follow-up Appointments:  Follow-up Information     [facility]. Schedule an appointment as soon as possible for a visit.   Why: With opioid use clinic Contact information: [address] [place]  [place] [address] [phone]                Hospital Course by problem list:  Raymond Lang is a 30 year old man with history of recreational opioid use, vaping, obesity, and seasonal allergies presenting to the [facility] ED after accidental drug overdose and admitted for acute hypoxic respiratory failure.   Acute Hypoxic Respiratory  Failure  Inhalation Lung Injury  Raymond Lang presented to the ED within an hour after being found unresponsive in his car following intranasal use of what the patient presumed to be Percocet. He was found to be hypoxic to the mid-70s in the ED and placed on 2 L supplemental oxygen. However, throughout his stay in the hospital he denied any respiratory symptoms. Initial CXR was unremarkable aside from possible hyperinflation. However, ED provider auscultated rales on exam and CXR was repeated - this time completely unremarkable. D-dimer was elevated at 0.94, although CTA chest did not show any evidence of PE. He did have rales and rhonchi noted on exam throughout his stay in the left lower lung. CTA showed findings consistent with inhalation lung injury (vs. Less likely PJP although HIV was negative). This is most likely due to chronic vaping, although acute pneumonitis from percocet may have contributed. He remained alert and oriented without increased work of breathing, cardiac findings, or any acute complaints throughout his time here and was discharged once leukocytosis resolved and he was HDS, saturating 97% on room air.   NSTEMI Type II  Initial EKG showed borderline LAD with LVH and possible RVH and ST elevation more consistent with early repolarization abnormality in inferior leads given that high-sensitivity troponins were only mildly elevated: 34 > 92 > 71 and he was asymptomatic. His BP's were variable although overall okay and ECHO was unremarkable.    Acute Kidney Injury On initial presentation, creatinine was elevated from baseline (1.17) to 1.34, although CK was normal and creatinine improved quickly overnight s/p 1L LR bolus and PO intake, consistent with pre-renal injury.   Polysubstance Use Disorder Patient endorsed inhaling Percocet ~once weekly, using Marijuana, vaping daily, recreational alcohol use, and cigarettes use (1 pack x 3 years) although quit smoking 9 months ago. He denied  history of IVDU or any other illicit / non-prescribed drugs including benzodiazepines. He says he only had Percocet PTA without other depressants. He initially seemed hesitant to discuss his substance use, although on repeat exam was consistent and does not currently feel his substance abuse is getting in the way of his day-to-day living. It does not concern him. We discussed the importance of cessation, especially of opioid use and vaping, discussed therapies including suboxone and naltrexone, and provided information regarding our [facility] opioid use clinic and how to schedule an appointment on discharge. He does mention he is interested in quitting Percocet specifically.    Mild Hypocalcemia  Corrected calcium was normal on arrival, although Ca was 8.6 on discharge. Would recommend repeating labs and checking magnesium.   Discharge Exam:   BP 140/79 (BP Location: Left Arm)   Pulse 83   Temp 98.6 F (37 C) (Oral)   Resp 20   Ht 5\' 7"  (1.702 m)   Wt 96 kg   SpO2 97%   BMI 33.16 kg/m  Discharge exam:  General: Patient appears well. No acute distress. Eyes: Sclera non-icteric. No conjunctival injection.  HENT: Neck is supple. No nasal discharge. Respiratory: Lungs are CTA, bilaterally. No wheezes, rales, or rhonchi.  Cardiovascular: Regular rate and rhythm.  No murmurs, rubs, or gallops. No lower extremity edema. Abdominal: Soft and non-tender to palpation. Bowel sounds intact. No rebound or guarding. Skin: No lesions. No rashes.  Psych: Normal affect. Normal tone of voice.   Pertinent Labs, Studies, and Procedures:   Initial Labs Notable For:  - WBC 14.2 - Cr 1.34, GFR >60, CK 198 - BNP 17.7 - HS Trop peaked 92 - D-dimer 0.94 - UDS THC +    CHEST - 2 VIEW COMPARISON:  01/28/2011 FINDINGS: The heart size and mediastinal contours are within normal limits. Both lungs are clear. The visualized skeletal structures are unremarkable. IMPRESSION: No active cardiopulmonary  disease. Electronically Signed   By: Alcide Clever M.D.   On: 09/29/2020 16:33  CHEST - 2 VIEW COMPARISON:  Radiograph 09/29/2020 FINDINGS: No focal consolidation. No convincing features of developing pulmonary edema with normal distribution of the pulmonary vascularity. No pneumothorax or visible effusion. The cardiomediastinal contours are unremarkable. Telemetry leads overlie the chest. No acute osseous or soft tissue abnormality. IMPRESSION: No convincing evidence of developing pulmonary edema. Lungs are clear. Electronically Signed   By: Kreg Shropshire M.D.   On: 09/29/2020 17:19  CT ANGIOGRAPHY CHEST WITH CONTRAST   TECHNIQUE: Multidetector CT imaging of the chest was performed using the standard protocol during bolus administration of intravenous contrast. Multiplanar CT image reconstructions and MIPs were obtained to evaluate the vascular anatomy.   CONTRAST:  41mL OMNIPAQUE IOHEXOL 350 MG/ML SOLN COMPARISON:  None. FINDINGS: Cardiovascular: Satisfactory opacification of the pulmonary arteries to the segmental level. No evidence of pulmonary embolism. Normal heart size. No pericardial effusion. Mediastinum/Nodes: No enlarged mediastinal, hilar, or axillary lymph nodes. Thyroid gland, trachea, and esophagus demonstrate no significant findings. Lungs/Pleura: No pleural effusion or pneumothorax. Bilateral centrilobular ground-glass nodularity and opacities most confluent in the LEFT lower lobe. Upper Abdomen: No acute abnormality. Musculoskeletal: Bilateral gynecomastia. Review of the MIP images confirms the above findings. IMPRESSION: 1. No evidence of pulmonary embolism. 2. Bilateral LEFT greater than RIGHT ground-glass nodularity with a basilar predominance. Findings are nonspecific and could reflect sequela of inhalational injury (vaping lung) or atypical infection (including PJP). Pulmonary edema is felt less likely. Electronically Signed   By: Meda Klinefelter MD   On: 09/30/2020 17:26  ECHO 09/30/20: 1. Left ventricular ejection fraction, by estimation, is 55 to 60%. The  left ventricle has normal function. The left ventricle has no regional  wall motion abnormalities. Left ventricular diastolic parameters were  normal.   2. Right ventricular systolic function is normal. The right ventricular  size is normal.   3. The mitral valve is normal in structure. No evidence of mitral valve  regurgitation. No evidence of mitral stenosis.   4. The aortic valve is tricuspid. Aortic valve regurgitation is not  visualized. No aortic stenosis is present.   5. The inferior vena cava is normal in size with greater than 50%  respiratory variability, suggesting right atrial pressure of 3 mmHg.   Discharge Instructions: Discharge Instructions     Call MD for:  difficulty breathing, headache or visual disturbances   Complete by: As directed    Call MD for:  extreme fatigue   Complete by: As directed    Call MD for:  persistant dizziness or light-headedness   Complete by: As directed    Call MD for:  severe uncontrolled pain   Complete by: As directed    Call MD for:  temperature >100.4   Complete by: As directed    Diet  general   Complete by: As directed    Discharge instructions   Complete by: As directed    Raymond Lang,   You were admitted to the hospital as your oxygen was found to be low after inhalation of percocet. You did require oxygen here in the hospital although your oxygen levels normalized on room air before discharge.   You were found to have an elevated D-dimer (marker of blood clots) so a CT scan of your chest was performed to rule out a blood clot. Thankfully, no blood clot was identified, although you were found to have significant injuries to your lungs from vaping.   Upon discharge from the hospital, it would be extremely beneficial to your health to stop vaping (and keep from using any tobacco products) and I highly encourage  that you stop using Percocet or other recreational illicit drugs. Any inhaled product can cause injury to your lungs, and opioids may be very addictive and can cause severe respiratory depression when used improperly.   Please be sure to follow up with your primary care doctor within the next couple of weeks to make sure that your lung function remains stable on room air. They can also discuss therapies commonly used to assist you if you do decide to stop using these substances.   Please call your primary care doctor or return to the ED if you experience persistent shortness of breath, chest pain, palpitations, loss of consciousness, light-headedness, dizziness, or any other concerning symptoms.   Thank you and take care,  Dr. Laddie AquasSpeakman   Increase activity slowly   Complete by: As directed        Signed: Glenford BayleySpeakman, Trica Usery, MD 09/30/2020, 6:51 PM   Pager: [phone]

## 2020-09-30 NOTE — Discharge Instructions (Signed)
Please schedule a follow up appointment with our Opioid Use Disorder clinic here at the [facility] on the ground (basement) floor of the hospital if you would like assistance / further discussion regarding opioid use cessation.     Accidental Drug Poisoning, Adult Accidental drug poisoning happens when a person accidentally takes too much of a substance, such as a prescription medicine, an over-the-counter medicine, a vitamin, a supplement, or an illegal drug. The effects of drug poisoning can be mild, dangerous, or even deadly. What are the causes? This condition is caused by taking too much of a medicine, illegal drug, or other substance. It often results from:  Lack of knowledge about a substance.  Using more than one substance at the same time.  An error made by the health care provider who prescribed the substance.  An error made by the pharmacist who filled the prescription.  A lapse in memory, such as forgetting that you have already taken a dose of the medicine.  Suddenly using a substance after a long period of not using it. The following substances and medicines are more likely to cause an accidental drug poisoning:  Medicines that treat mental problems (psychotropic medicines).  Pain medicines.  Cocaine.  Heroin.  Multivitamins that contain iron.  Over-the-counter cold and cough medicines. What increases the risk? This condition is more likely to occur in:  Elderly adults. Elderly adults are at risk because they may: ? Be taking many different medicines. ? Have difficulty reading labels. ? Forget when they last took their medicine.  People who use illegal drugs.  People who drink alcohol while using illegal drugs or certain medicines.  People with certain mental health conditions. What are the signs or symptoms? Symptoms of this condition depend on the substance and the amount that was taken. Common symptoms include:  Behavior changes, such as  confusion.  Sleepiness.  Weakness.  Slowed breathing.  Nausea and vomiting.  Seizures.  Very large or small eye pupil size. A drug poisoning can cause a very serious condition in which your blood pressure drops to a low level (shock). Symptoms of shock include:  Cold and clammy skin.  Pale skin.  Blue lips.  Very slow breathing.  Extreme sleepiness.  Severe confusion.  Dizziness or fainting. How is this diagnosed? This condition is diagnosed based on:  Your symptoms. You will be asked about the substances you took and when you took them.  A physical exam. You may also have other tests, including:  Urine tests.  Blood tests.  An electrocardiogram (ECG). How is this treated? This condition may need to be treated right away at the hospital. Treatment may involve:  Getting fluids and electrolytes through an IV.  Having a breathing tube inserted in your airway (endotracheal tube) to help you breathe.  Taking medicines. These may include medicines that: ? Absorb any substance that is in your digestive system. ? Block or reverse the effect of the substance that caused the drug poisoning.  Having your blood filtered through an artificial kidney machine (hemodialysis).  Ongoing counseling and mental health support. This may be provided if you used an illegal drug. Follow these instructions at home: Medicines  Take over-the-counter and prescription medicines only as told by your health care provider.  Before taking a new medicine, ask your health care provider whether the medicine: ? May cause side effects. ? Might react with other medicines.  Keep a list of all the medicines that you take, including over-the-counter medicines,  vitamins, supplements, and herbs. Bring this list with you to all of your medical visits.   General instructions  Drink enough fluid to keep your urine pale yellow.  If you are working with a counselor or mental health professional,  make sure to follow his or her instructions.  Do not drink alcohol if: ? Your health care provider tells you not to drink. ? You are pregnant, may be pregnant, or are planning to become pregnant.  If you drink alcohol, limit how much you have: ? 0-1 drink a day for women. ? 0-2 drinks a day for men.  Be aware of how much alcohol is in your drink. In the [place] one drink equals one typical bottle of beer (12 oz), one-half glass of wine (5 oz), or one shot of hard liquor (1 oz).  Keep all follow-up visits as told by your health care provider. This is important.   How is this prevented?  Get help if you are struggling with: ? Alcohol or drug use. ? Depression or another mental health problem.  Keep the phone number of your local poison control center near your phone or on your cell phone. The hotline of the [employer] is (800365-862-3609.  Store all medicines in safety containers that are out of the reach of children.  Read the drug inserts that come with your medicines.  Create a system for taking your medicine, such as a pillbox, that will help you avoid taking too much of the medicine.  Do not drink alcohol while taking medicines unless your health care provider approves.  Do not use illegal drugs.  Do not take medicines that are not prescribed for you.   Contact a health care provider if:  Your symptoms return.  You develop new symptoms or side effects after taking a medicine.  You have questions about possible drug poisoning. Call your local poison control center at [phone]. Get help right away if:  You think that you or someone else may have taken too much of a substance.  You or someone else is having symptoms of drug poisoning. Summary  Accidental drug poisoning happens when a person accidentally takes too much of a substance, such as a prescription medicine, an over-the-counter medicine, a vitamin, a supplement, or an  illegal drug.  The effects of drug poisoning can be mild, dangerous, or even deadly.  This condition is diagnosed based on your symptoms and a physical exam. You will be asked to tell your health care provider which substances you took and when you took them.  This condition may need to be treated right away at the hospital. This information is not intended to replace advice given to you by your health care provider. Make sure you discuss any questions you have with your health care provider. Document Revised: 04/04/2017 Document Reviewed: 03/24/2017 Elsevier Patient Education  2021 [employer].

## 2020-09-30 NOTE — ED Notes (Signed)
The pt has disconnected his pulse ox and his monitor

## 2020-09-30 NOTE — ED Notes (Signed)
The puldr ox wire was poor quality.  Wire changed out  sats in the 95-100%

## 2023-01-05 IMAGING — CR DG CHEST 2V
2 series · 2 of 2 positions shown · non-contrast
Comparison: Radiograph 09/29/2020

CLINICAL DATA: Possible pulmonary edema

EXAM:
CHEST - 2 VIEW

[chest pa]
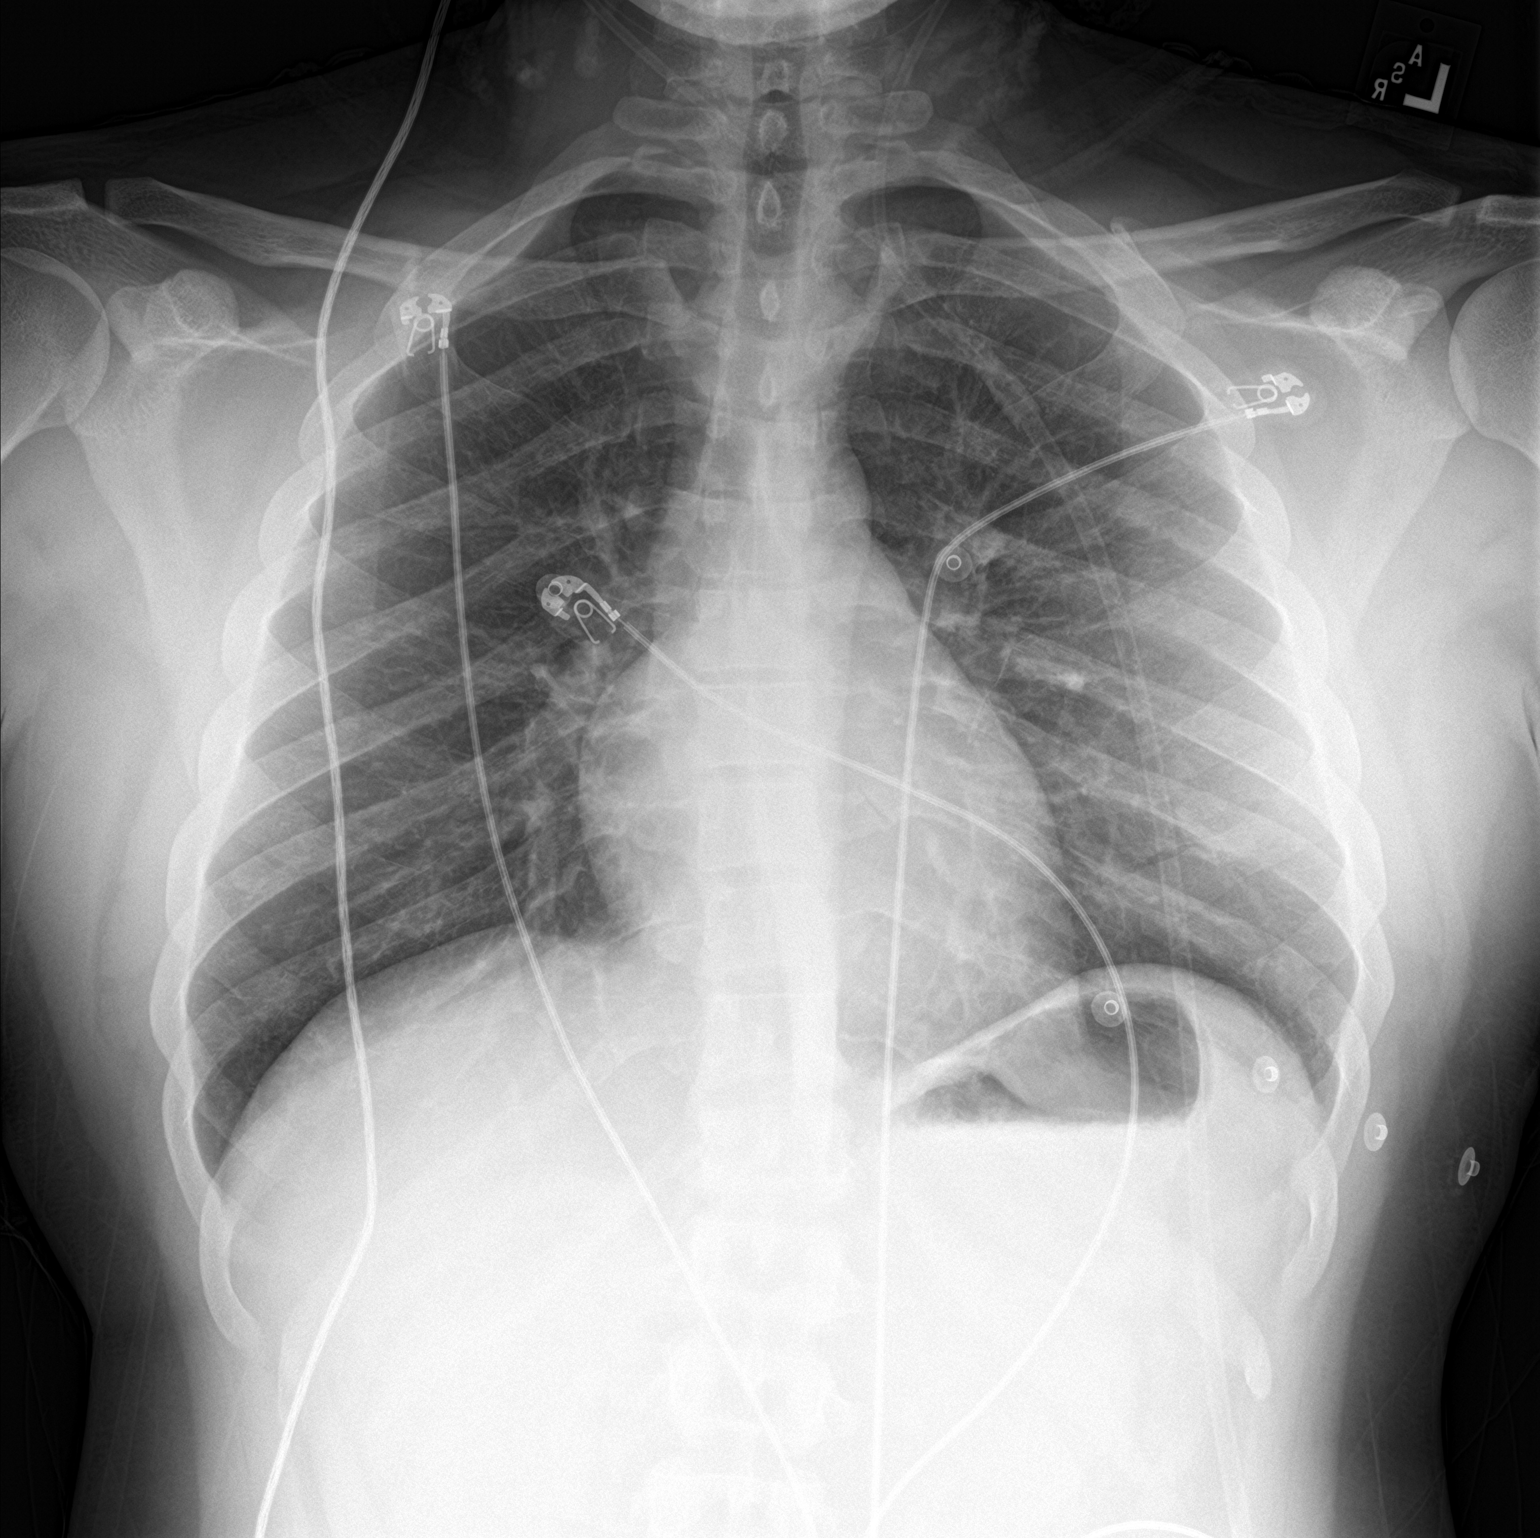

[chest lat]
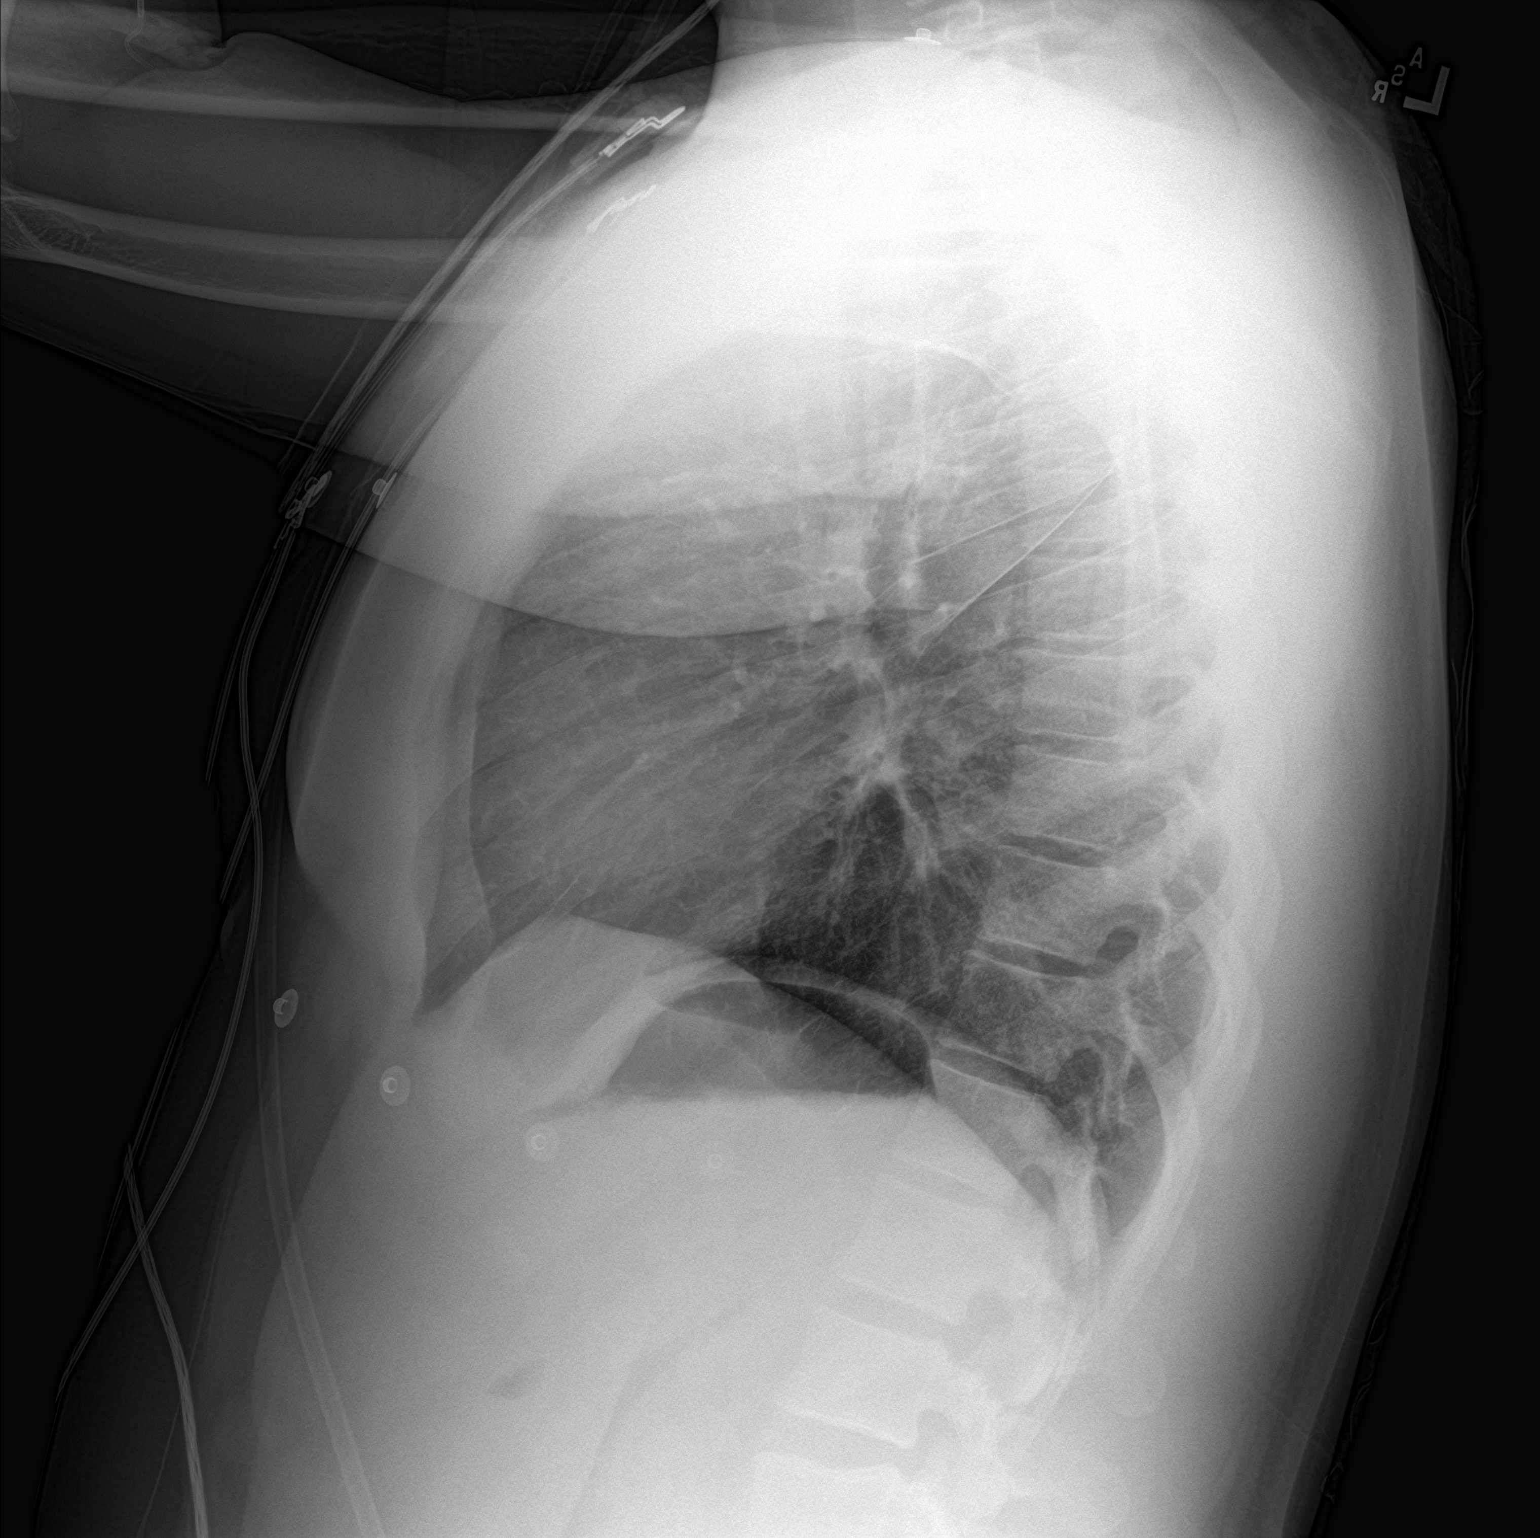

[2 of 2 positions shown; findings below may reference images not displayed]

FINDINGS: No focal consolidation. No convincing features of developing
pulmonary edema with normal distribution of the pulmonary
vascularity. No pneumothorax or visible effusion. The
cardiomediastinal contours are unremarkable. Telemetry leads overlie
the chest. No acute osseous or soft tissue abnormality.
IMPRESSION: No convincing evidence of developing pulmonary edema. Lungs are
clear.

## 2023-01-06 IMAGING — CT CT ANGIO CHEST
2 of 7 series · 18 of 46 positions shown · IV contrast (APPLIED)
Comparison: None.

CLINICAL DATA: Shortness of breath

EXAM:
CT ANGIOGRAPHY CHEST WITH CONTRAST
TECHNIQUE: Multidetector CT imaging of the chest was performed using the
standard protocol during bolus administration of intravenous
contrast. Multiplanar CT image reconstructions and MIPs were
obtained to evaluate the vascular anatomy.
CONTRAST:  75mL OMNIPAQUE IOHEXOL 350 MG/ML SOLN

[Series 6: thins · axial · 0.72mm/px · z∈[+1214,+1443]mm · 15 of 369 slices shown]
[im 21/369  lung]
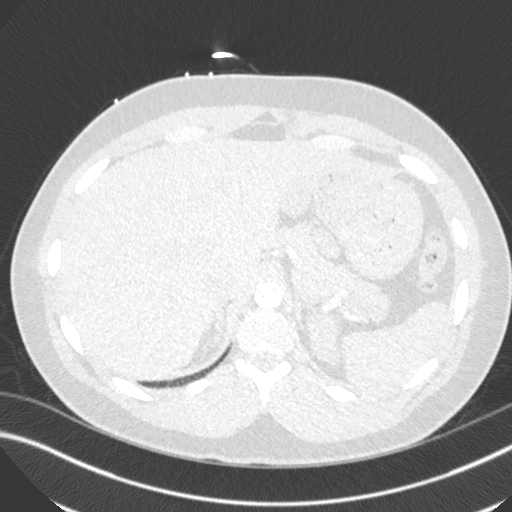
[im 41/369  soft-tissue]
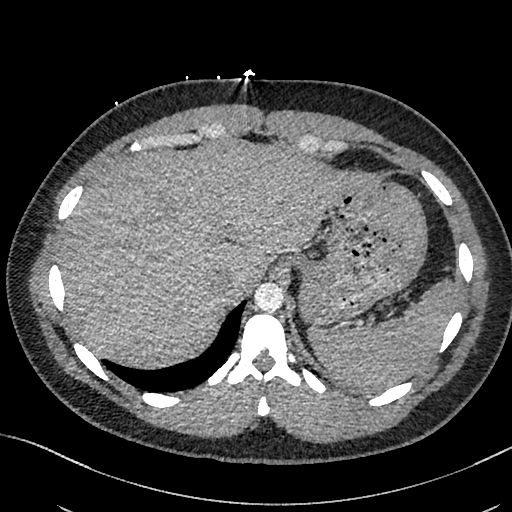
[im 62/369  lung]
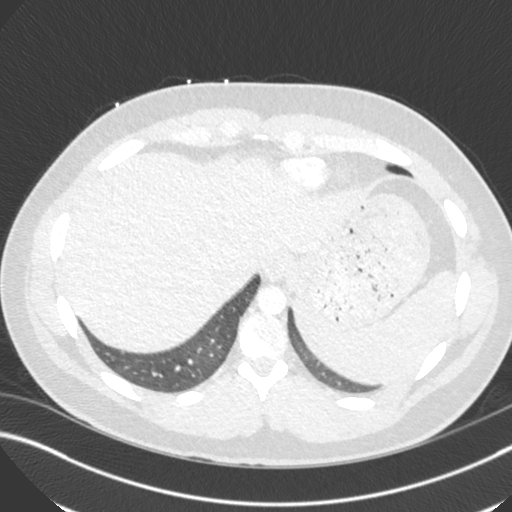
[im 82/369  soft-tissue]
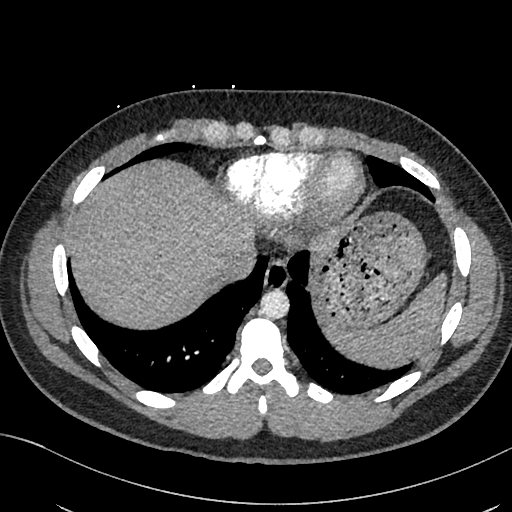
[im 123/369  lung]
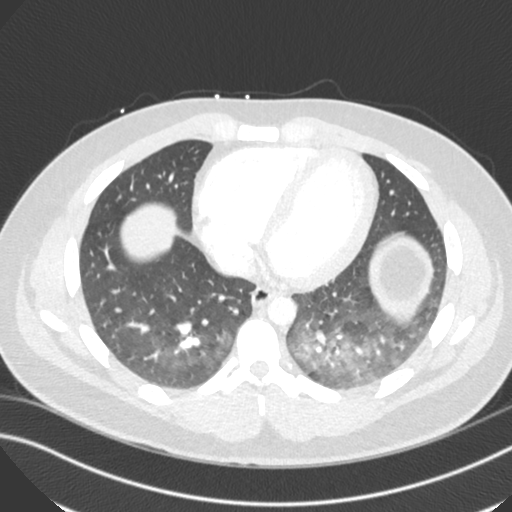
[im 144/369  soft-tissue]
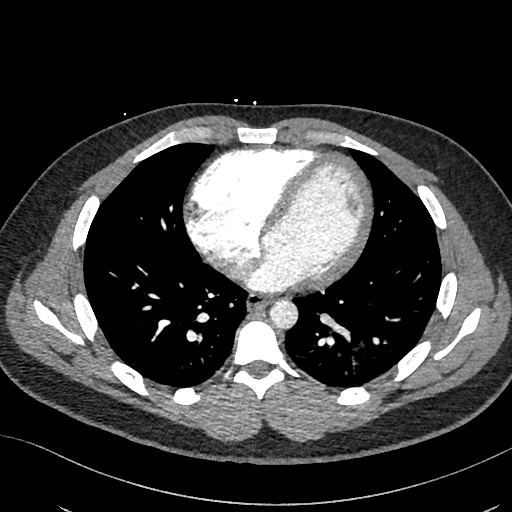
[im 164/369  lung]
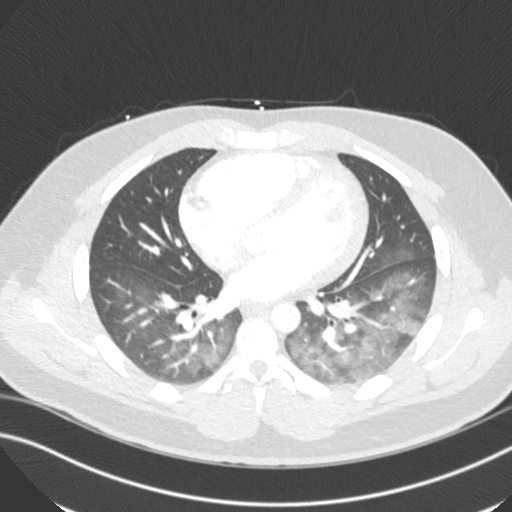
[im 185/369  soft-tissue]
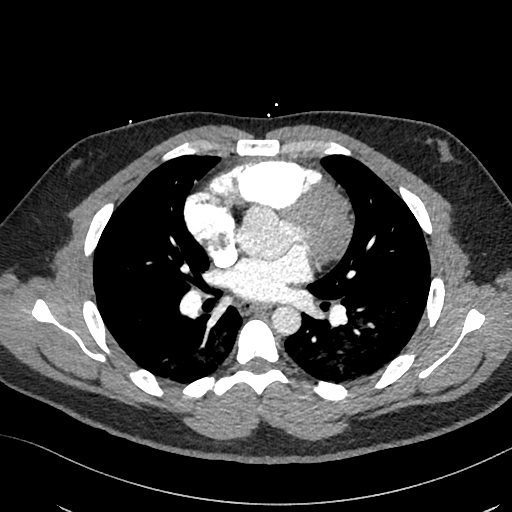
[im 205/369  lung]
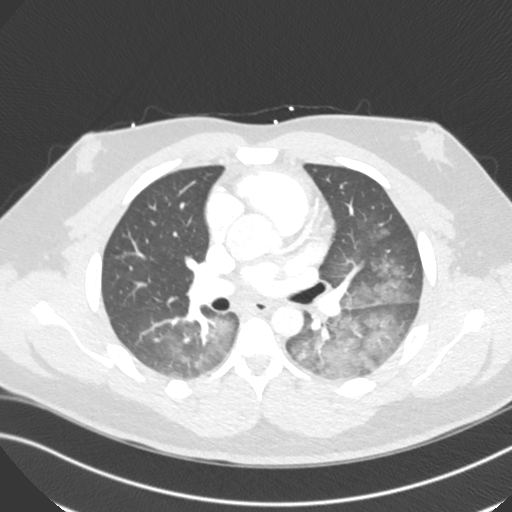
[im 225/369  soft-tissue]
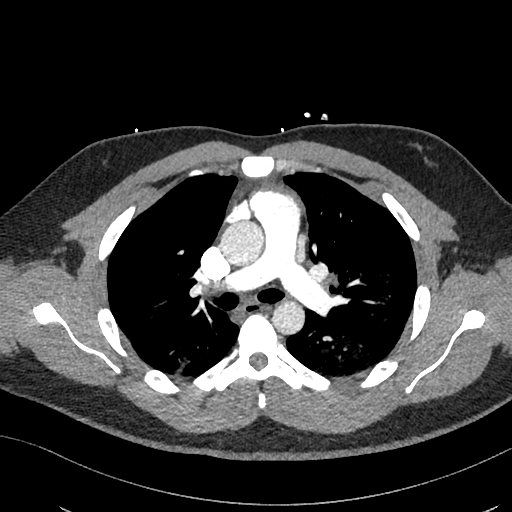
[im 246/369  lung]
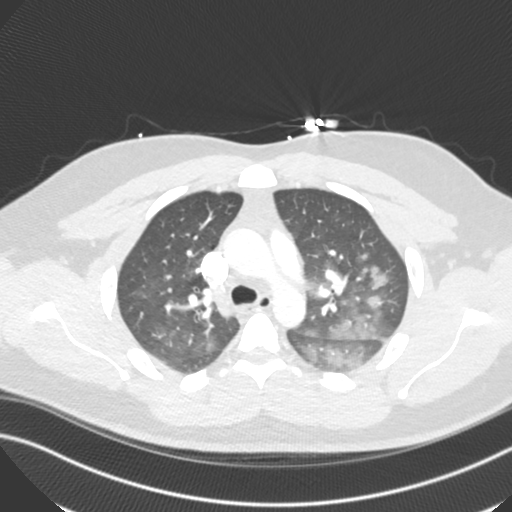
[im 287/369  soft-tissue]
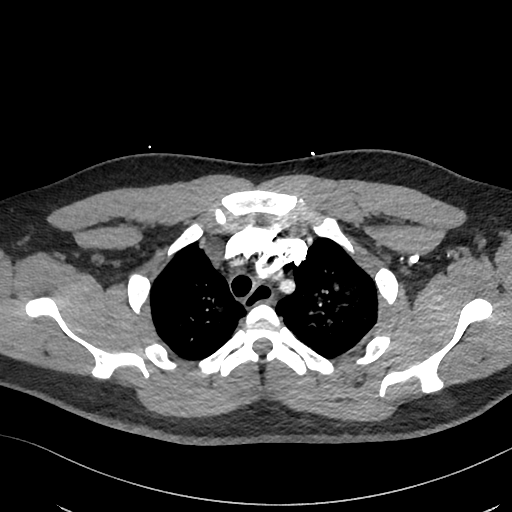
[im 307/369  lung]
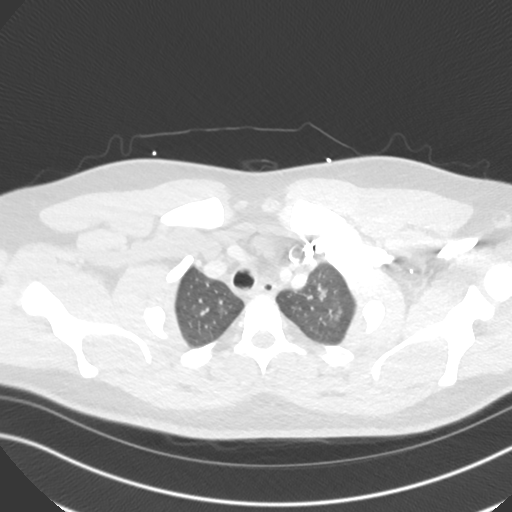
[im 328/369  soft-tissue]
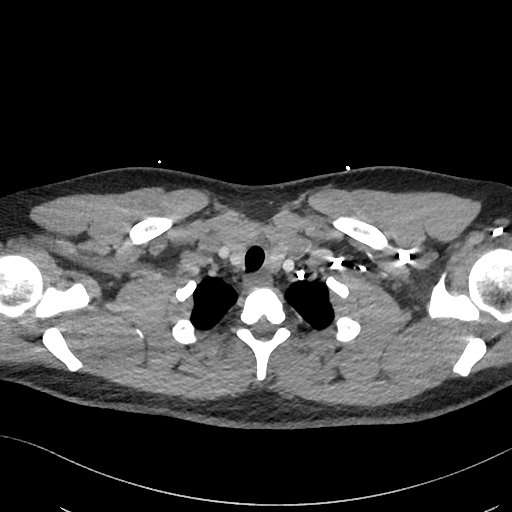
[im 348/369  lung]
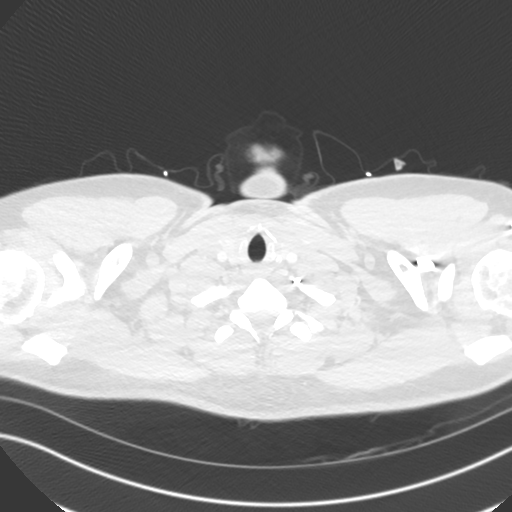

[Series 8: cor · coronal · 0.61mm/px · 3 of 153 slices shown]
[im 39/153  soft-tissue]
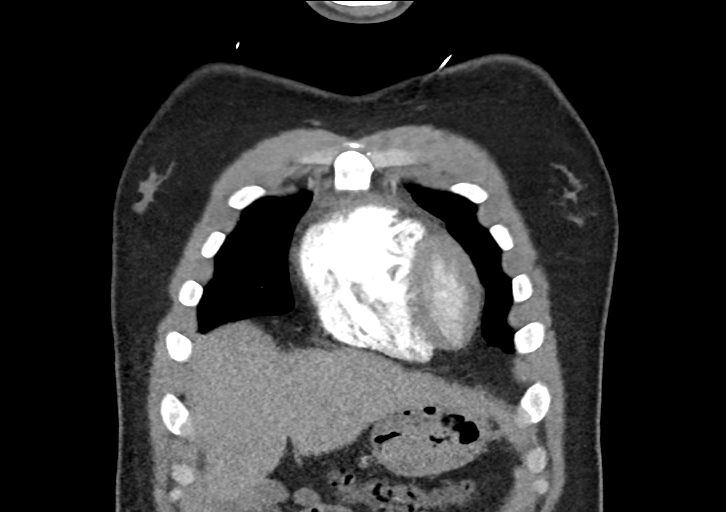
[im 77/153  soft-tissue]
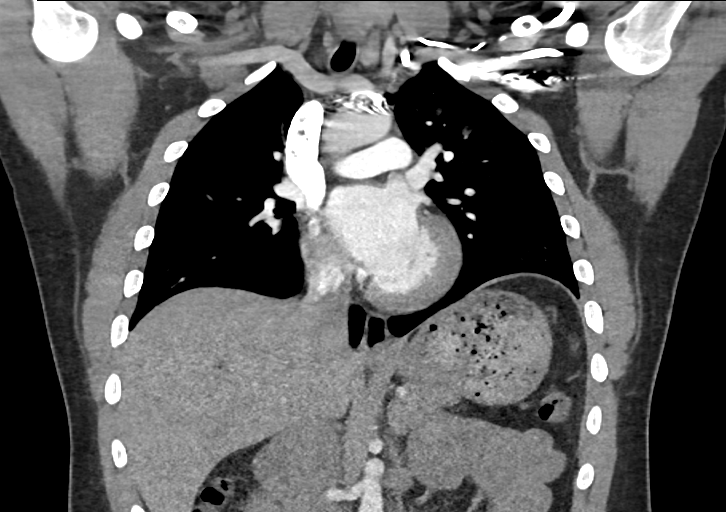
[im 115/153  soft-tissue]
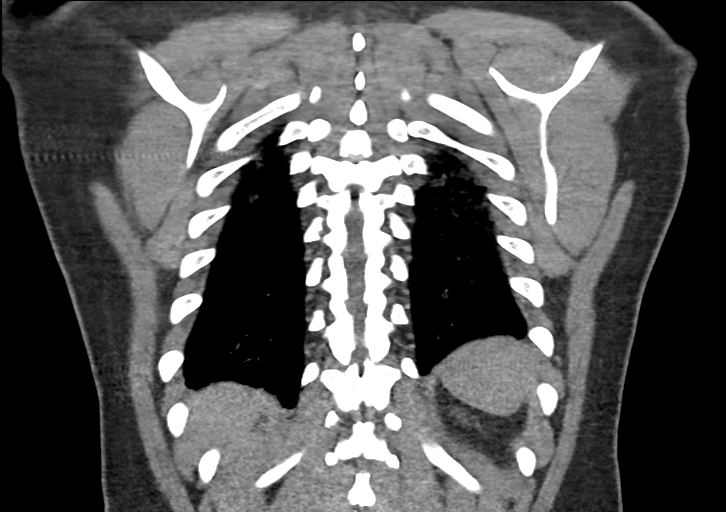

[18 of 46 positions shown; findings below may reference images not displayed]

FINDINGS: Cardiovascular: Satisfactory opacification of the pulmonary arteries
to the segmental level. No evidence of pulmonary embolism. Normal
heart size. No pericardial effusion.

Mediastinum/Nodes: No enlarged mediastinal, hilar, or axillary lymph
nodes. Thyroid gland, trachea, and esophagus demonstrate no
significant findings.

Lungs/Pleura: No pleural effusion or pneumothorax. Bilateral
centrilobular ground-glass nodularity and opacities most confluent
in the LEFT lower lobe.

Upper Abdomen: No acute abnormality.

Musculoskeletal: Bilateral gynecomastia.

Review of the MIP images confirms the above findings.
IMPRESSION: 1. No evidence of pulmonary embolism.
2. Bilateral LEFT greater than RIGHT ground-glass nodularity with a
basilar predominance. Findings are nonspecific and could reflect
sequela of inhalational injury (vaping lung) or atypical infection
(including PJP). Pulmonary edema is felt less likely.
